# Patient Record
Sex: Male | Born: 1990 | Race: White | Hispanic: No | Marital: Single | Smoking: Never smoker
Health system: Southern US, Community
[De-identification: ages and names within clinical notes are randomized; demographics above are authoritative.]

## PROBLEM LIST (undated history)

## (undated) DIAGNOSIS — R55 Syncope and collapse: Secondary | ICD-10-CM

## (undated) DIAGNOSIS — J45909 Unspecified asthma, uncomplicated: Secondary | ICD-10-CM

## (undated) DIAGNOSIS — F909 Attention-deficit hyperactivity disorder, unspecified type: Secondary | ICD-10-CM

## (undated) HISTORY — DX: Unspecified asthma, uncomplicated: J45.909

---
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
## 2015-03-03 ENCOUNTER — Emergency Department: Payer: Self-pay | Admitting: Emergency Medicine

## 2015-09-26 ENCOUNTER — Ambulatory Visit (INDEPENDENT_AMBULATORY_CARE_PROVIDER_SITE_OTHER): Payer: Self-pay | Admitting: Family Medicine

## 2015-09-26 VITALS — BP 100/60 | HR 71 | Temp 98.2°F | Resp 16 | Ht 73.0 in | Wt 213.6 lb

## 2015-09-26 DIAGNOSIS — M545 Low back pain: Secondary | ICD-10-CM

## 2015-09-26 NOTE — Progress Notes (Signed)
 @[facility] @  This chart was scribed for Elvina SidleKurt Lauenstein, MD by Andrew Auaven Small, ED Scribe. This patient was seen in room 5 and the patient's care was started at 9:30 AM.  Patient ID: Joshua Santos MRN: [id number], DOB: 01-Oct-1991, 24 y.o. Date of Encounter: 09/26/2015, 9:30 AM  Primary Physician: No primary care provider on file.  Chief Complaint:  Chief Complaint  Patient presents with   Back Pain    lower x 24 hours, per pt "does not feel pain right now" had fallen out of the shower    HPI: 24 y.o. year old male with history below presents with low back pain that began 2 days ago. Pt states he was getting out of a shower when he slipped and fell on his bottom. He did have initial soreness to lower back but states pain has subsided. Pt job wanted him to come in and get evaluated. Pt works as a Pensions consultanttechnician.   Past Medical History  Diagnosis Date   Asthma      Home Meds: Prior to Admission medications   Not on File    Allergies:  Allergies  Allergen Reactions   Motrin [Ibuprofen]     Face swelled    Social History   Social History   Marital Status: Married    Spouse Name: N/A   Number of Children: N/A   Years of Education: N/A   Occupational History   Not on file.   Social History Main Topics   Smoking status: Never Smoker    Smokeless tobacco: Not on file   Alcohol Use: No   Drug Use: No   Sexual Activity: Not on file   Other Topics Concern   Not on file   Social History Narrative   No narrative on file     Review of Systems: Constitutional: negative for chills, fever, night sweats, weight changes, or fatigue  HEENT: negative for vision changes, hearing loss, congestion, rhinorrhea, ST, epistaxis, or sinus pressure Cardiovascular: negative for chest pain or palpitations Respiratory: negative for hemoptysis, wheezing, shortness of breath, or cough Abdominal: negative for abdominal pain, nausea, vomiting, diarrhea, or  constipation Dermatological: negative for rash Neurologic: negative for headache, dizziness, or syncope All other systems reviewed and are otherwise negative with the exception to those above and in the HPI.   Physical Exam: Blood pressure 100/60, pulse 71, temperature 98.2 F (36.8 C), temperature source Oral, resp. rate 16, height 6\' 1"  (1.854 m), weight 213 lb 9.6 oz (96.888 kg), SpO2 97 %., Body mass index is 28.19 kg/(m^2). General: Well developed, well nourished, in no acute distress. Head: Normocephalic, atraumatic, eyes without discharge, sclera non-icteric, nares are without discharge. Bilateral auditory canals clear, TM's are without perforation, pearly grey and translucent with reflective cone of light bilaterally. Oral cavity moist, posterior pharynx without exudate, erythema, peritonsillar abscess, or post nasal drip.  Neck: Supple. No thyromegaly. Full ROM. No lymphadenopathy. Lungs: Clear bilaterally to auscultation without wheezes, rales, or rhonchi. Breathing is unlabored. Heart: RRR with S1 S2. No murmurs, rubs, or gallops appreciated. Abdomen: Soft, non-tender, non-distended with normoactive bowel sounds. No hepatomegaly. No rebound/guarding. No obvious abdominal masses. Msk:  Strength and tone normal for age.  Full ROM of back. Negative straight leg raise Extremities/Skin: Warm and dry. No clubbing or cyanosis. No edema. No rashes or suspicious lesions. Neuro: Alert and oriented X 3. Moves all extremities spontaneously. Gait is normal. CNII-XII grossly in tact. Psych:  Responds to questions appropriately with a normal affect.  ASSESSMENT AND PLAN:  24 y.o. year old male with recent minor back injury and needs note to return to work  By signing my name below, I, Raven Small, attest that this documentation has been prepared under the direction and in the presence of Elvina Sidle, MD.  Electronically Signed: Andrew Au, ED Scribe. 09/26/2015. 9:35  AM.   Signed, Elvina Sidle, MD 09/26/2015 9:30 AM

## 2018-11-15 ENCOUNTER — Other Ambulatory Visit: Payer: Self-pay

## 2018-11-15 ENCOUNTER — Encounter: Payer: Self-pay | Admitting: Emergency Medicine

## 2018-11-15 ENCOUNTER — Ambulatory Visit: Payer: 59 | Admitting: Emergency Medicine

## 2018-11-15 VITALS — BP 102/67 | HR 52 | Temp 97.9°F | Resp 16 | Ht 72.5 in | Wt 200.6 lb

## 2018-11-15 DIAGNOSIS — J4520 Mild intermittent asthma, uncomplicated: Secondary | ICD-10-CM | POA: Diagnosis not present

## 2018-11-15 MED ORDER — BECLOMETHASONE DIPROP HFA 40 MCG/ACT IN AERB: 1.0000 | INHALATION_SPRAY | Freq: Two times a day (BID) | RESPIRATORY_TRACT | 11 refills | Status: DC

## 2018-11-15 MED ORDER — ALBUTEROL SULFATE HFA 108 (90 BASE) MCG/ACT IN AERS: 2.0000 | INHALATION_SPRAY | Freq: Four times a day (QID) | RESPIRATORY_TRACT | 0 refills | Status: DC | PRN

## 2018-11-15 NOTE — Patient Instructions (Addendum)
If you have lab work done today you will be contacted with your lab results within the next 2 weeks.  If you have not heard from us then please contact us. The fastest way to get your results is to register for My Chart.   IF you received an x-ray today, you will receive an invoice from [facility]. Please contact [facility] at [phone] with questions or concerns regarding your invoice.   IF you received labwork today, you will receive an invoice from [facility]. Please contact [facility] at [phone] with questions or concerns regarding your invoice.   Our billing staff will not be able to assist you with questions regarding bills from these companies.  You will be contacted with the lab results as soon as they are available. The fastest way to get your results is to activate your My Chart account. Instructions are located on the last page of this paperwork. If you have not heard from us regarding the results in 2 weeks, please contact this office.     Asthma, Adult Asthma is a condition of the lungs in which the airways tighten and narrow. Asthma can make it hard to breathe. Asthma cannot be cured, but medicine and lifestyle changes can help control it. Asthma may be started (triggered) by:  Animal skin flakes (dander).  Dust.  Cockroaches.  Pollen.  Mold.  Smoke.  Cleaning products.  Hair sprays or aerosol sprays.  Paint fumes or strong smells.  Cold air, weather changes, and winds.  Crying or laughing hard.  Stress.  Certain medicines or drugs.  Foods, such as dried fruit, potato chips, and sparkling grape juice.  Infections or conditions (colds, flu).  Exercise.  Certain medical conditions or diseases.  Exercise or tiring activities.  Follow these instructions at home:  Take medicine as told by your doctor.  Use a peak flow meter as told by your doctor. A peak flow meter is a tool that measures how well the lungs are  working.  Record and keep track of the peak flow meter's readings.  Understand and use the asthma action plan. An asthma action plan is a written plan for taking care of your asthma and treating your attacks.  To help prevent asthma attacks: ? Do not smoke. Stay away from secondhand smoke. ? Change your heating and air conditioning filter often. ? Limit your use of fireplaces and wood stoves. ? Get rid of pests (such as roaches and mice) and their droppings. ? Throw away plants if you see mold on them. ? Clean your floors. Dust regularly. Use cleaning products that do not smell. ? Have someone vacuum when you are not home. Use a vacuum cleaner with a HEPA filter if possible. ? Replace carpet with wood, tile, or vinyl flooring. Carpet can trap animal skin flakes and dust. ? Use allergy-proof pillows, mattress covers, and box spring covers. ? Wash bed sheets and blankets every week in hot water and dry them in a dryer. ? Use blankets that are made of polyester or cotton. ? Clean bathrooms and kitchens with bleach. If possible, have someone repaint the walls in these rooms with mold-resistant paint. Keep out of the rooms that are being cleaned and painted. ? Wash hands often. Contact a doctor if:  You have make a whistling sound when breaking (wheeze), have shortness of breath, or have a cough even if taking medicine to prevent attacks.  The colored mucus you cough up (sputum) is thicker than usual.  The colored mucus you cough up changes from clear or white to yellow, green, gray, or bloody.  You have problems from the medicine you are taking such as: ? A rash. ? Itching. ? Swelling. ? Trouble breathing.  You need reliever medicines more than 2-3 times a week.  Your peak flow measurement is still at 50-79% of your personal best after following the action plan for 1 hour.  You have a fever. Get help right away if:  You seem to be worse and are not responding to medicine during  an asthma attack.  You are short of breath even at rest.  You get short of breath when doing very little activity.  You have trouble eating, drinking, or talking.  You have chest pain.  You have a fast heartbeat.  Your lips or fingernails start to turn blue.  You are light-headed, dizzy, or faint.  Your peak flow is less than 50% of your personal best. This information is not intended to replace advice given to you by your health care provider. Make sure you discuss any questions you have with your health care provider. Document Released: 05/12/2008 Document Revised: 05/01/2016 Document Reviewed: 06/23/2013 Elsevier Interactive Patient Education  2017 [employer]

## 2018-11-15 NOTE — Progress Notes (Signed)
Joshua Santos 27 y.o.   Chief Complaint  Patient presents with  . Establish Care  . Asthma    per patient has Hx- SOB with wheezing    HISTORY OF PRESENT ILLNESS: This is a 27 y.o. male with a history of asthma, on no medications at this time.  Getting frequent asthma attacks.  Feels he needs to be on medication.  No other complaints or medical concerns. Here to establish care.  HPI   Prior to Admission medications   Medication Sig Start Date End Date Taking? Authorizing Provider  Multiple Vitamin (MULTI VITAMIN DAILY PO) Take by mouth daily.   Yes [provider]    Allergies  Allergen Reactions  . Motrin [Ibuprofen]     Face swelled    There are no active problems to display for this patient.   Past Medical History:  Diagnosis Date  . Asthma     No past surgical history on file.  Social History   Socioeconomic History  . Marital status: Married    Spouse name: Not on file  . Number of children: Not on file  . Years of education: Not on file  . Highest education level: Not on file  Occupational History  . Not on file  Social Needs  . Financial resource strain: Not on file  . Food insecurity:    Worry: Not on file    Inability: Not on file  . Transportation needs:    Medical: Not on file    Non-medical: Not on file  Tobacco Use  . Smoking status: Never Smoker  . Smokeless tobacco: Never Used  Substance and Sexual Activity  . Alcohol use: No    Alcohol/week: 0.0 standard drinks    Comment: slightly-wine  . Drug use: No  . Sexual activity: Not on file  Lifestyle  . Physical activity:    Days per week: Not on file    Minutes per session: Not on file  . Stress: Not on file  Relationships  . Social connections:    Talks on phone: Not on file    Gets together: Not on file    Attends religious service: Not on file    Active member of club or organization: Not on file    Attends meetings of clubs or organizations: Not on file   Relationship status: Not on file  . Intimate partner violence:    Fear of current or ex partner: Not on file    Emotionally abused: Not on file    Physically abused: Not on file    Forced sexual activity: Not on file  Other Topics Concern  . Not on file  Social History Narrative  . Not on file    Family History  Problem Relation Age of Onset  . Anxiety disorder Mother   . Cancer Maternal Grandfather        brain     Review of Systems  Constitutional: Negative.  Negative for chills and fever.  HENT: Negative.  Negative for congestion, hearing loss, nosebleeds and sore throat.   Eyes: Negative.  Negative for blurred vision and double vision.  Respiratory: Positive for shortness of breath and wheezing.   Cardiovascular: Negative.  Negative for chest pain and palpitations.  Gastrointestinal: Negative.  Negative for abdominal pain, blood in stool, diarrhea, melena, nausea and vomiting.  Genitourinary: Negative.  Negative for dysuria.  Skin: Negative.  Negative for rash.  Neurological: Negative.  Negative for dizziness and headaches.  Endo/Heme/Allergies: Negative.  All other systems reviewed and are negative.   Vitals:   11/15/18 1015  BP: 102/67  Pulse: (!) 52  Resp: 16  Temp: 97.9 F (36.6 C)  SpO2: 95%    Physical Exam  Constitutional: He is oriented to person, place, and time. He appears well-developed and well-nourished.  HENT:  Head: Normocephalic and atraumatic.  Right Ear: External ear normal.  Left Ear: External ear normal.  Nose: Nose normal.  Mouth/Throat: Oropharynx is clear and moist.  Eyes: Pupils are equal, round, and reactive to light. Conjunctivae and EOM are normal.  Neck: Normal range of motion. Neck supple. No thyromegaly present.  Cardiovascular: Normal rate, regular rhythm and normal heart sounds.  Pulmonary/Chest: Effort normal and breath sounds normal.  Musculoskeletal: Normal range of motion.  Lymphadenopathy:    He has no cervical  adenopathy.  Neurological: He is alert and oriented to person, place, and time. No sensory deficit. He exhibits normal muscle tone. Coordination normal.  Skin: Skin is warm and dry. Capillary refill takes less than 2 seconds.  Psychiatric: He has a normal mood and affect. His behavior is normal.  Vitals reviewed.  A total of 25 minutes was spent in the room with the patient, greater than 50% of which was in counseling/coordination of care regarding diagnosis, medications, treatment, prognosis, and need for follow-up.   ASSESSMENT & PLAN: Joshua Santos was seen today for establish care and asthma.  Diagnoses and all orders for this visit:  Mild intermittent asthma without complication -     beclomethasone (QVAR REDIHALER) 40 MCG/ACT inhaler; Inhale 1 puff into the lungs 2 (two) times daily. -     albuterol (PROVENTIL HFA;VENTOLIN HFA) 108 (90 Base) MCG/ACT inhaler; Inhale 2 puffs into the lungs every 6 (six) hours as needed for wheezing or shortness of breath.     Patient Instructions       If you have lab work done today you will be contacted with your lab results within the next 2 weeks.  If you have not heard from [place] then please contact us. The fastest way to get your results is to register for My Chart.   IF you received an x-ray today, you will receive an invoice from [facility]. Please contact [facility] at [phone] with questions or concerns regarding your invoice.   IF you received labwork today, you will receive an invoice from [place]. Please contact [facility] at [phone] with questions or concerns regarding your invoice.   Our billing staff will not be able to assist you with questions regarding bills from these companies.  You will be contacted with the lab results as soon as they are available. The fastest way to get your results is to activate your My Chart account. Instructions are located on the last page of this paperwork. If you have not  heard from [place] regarding the results in 2 weeks, please contact this office.     Asthma, Adult Asthma is a condition of the lungs in which the airways tighten and narrow. Asthma can make it hard to breathe. Asthma cannot be cured, but medicine and lifestyle changes can help control it. Asthma may be started (triggered) by:  Animal skin flakes (dander).  Dust.  Cockroaches.  Pollen.  Mold.  Smoke.  Cleaning products.  Hair sprays or aerosol sprays.  Paint fumes or strong smells.  Cold air, weather changes, and winds.  Crying or laughing hard.  Stress.  Certain medicines or drugs.  Foods, such as dried fruit, potato  chips, and sparkling grape juice.  Infections or conditions (colds, flu).  Exercise.  Certain medical conditions or diseases.  Exercise or tiring activities.  Follow these instructions at home:  Take medicine as told by your doctor.  Use a peak flow meter as told by your doctor. A peak flow meter is a tool that measures how well the lungs are working.  Record and keep track of the peak flow meter's readings.  Understand and use the asthma action plan. An asthma action plan is a written plan for taking care of your asthma and treating your attacks.  To help prevent asthma attacks: ? Do not smoke. Stay away from secondhand smoke. ? Change your heating and air conditioning filter often. ? Limit your use of fireplaces and wood stoves. ? Get rid of pests (such as roaches and mice) and their droppings. ? Throw away plants if you see mold on them. ? Clean your floors. Dust regularly. Use cleaning products that do not smell. ? Have someone vacuum when you are not home. Use a vacuum cleaner with a HEPA filter if possible. ? Replace carpet with wood, tile, or vinyl flooring. Carpet can trap animal skin flakes and dust. ? Use allergy-proof pillows, mattress covers, and box spring covers. ? Wash bed sheets and blankets every week in hot water and dry them  in a dryer. ? Use blankets that are made of polyester or cotton. ? Clean bathrooms and kitchens with bleach. If possible, have someone repaint the walls in these rooms with mold-resistant paint. Keep out of the rooms that are being cleaned and painted. ? Wash hands often. Contact a doctor if:  You have make a whistling sound when breaking (wheeze), have shortness of breath, or have a cough even if taking medicine to prevent attacks.  The colored mucus you cough up (sputum) is thicker than usual.  The colored mucus you cough up changes from clear or white to yellow, green, gray, or bloody.  You have problems from the medicine you are taking such as: ? A rash. ? Itching. ? Swelling. ? Trouble breathing.  You need reliever medicines more than 2-3 times a week.  Your peak flow measurement is still at 50-79% of your personal best after following the action plan for 1 hour.  You have a fever. Get help right away if:  You seem to be worse and are not responding to medicine during an asthma attack.  You are short of breath even at rest.  You get short of breath when doing very little activity.  You have trouble eating, drinking, or talking.  You have chest pain.  You have a fast heartbeat.  Your lips or fingernails start to turn blue.  You are light-headed, dizzy, or faint.  Your peak flow is less than 50% of your personal best. This information is not intended to replace advice given to you by your health care provider. Make sure you discuss any questions you have with your health care provider. Document Released: 05/12/2008 Document Revised: 05/01/2016 Document Reviewed: 06/23/2013 Elsevier Interactive Patient Education  2017 [employer] Inc.     Edwina BarthMiguel Alder Murri, MD [facility]

## 2018-12-25 ENCOUNTER — Other Ambulatory Visit: Payer: Self-pay | Admitting: Emergency Medicine

## 2018-12-25 DIAGNOSIS — J4520 Mild intermittent asthma, uncomplicated: Secondary | ICD-10-CM

## 2019-04-08 ENCOUNTER — Telehealth: Payer: Self-pay | Admitting: Emergency Medicine

## 2019-04-08 NOTE — Telephone Encounter (Signed)
Please change if appropriate.

## 2019-04-08 NOTE — Telephone Encounter (Signed)
Copied from CRM [id number]. Topic: General - Other >> Apr 08, 2019 11:42 AM Herby Abraham C wrote: Reason for CRM: pt came in and was prescribed VENTOLIN HFA 108 (90 Base) MCG/ACT inhaler. Pt is request to change to the Albuterol instead. Pt says that it is less expensive for him.     Pharmacy: [id number] [place], [place] - [address] [phone] (Phone) [phone] (Fax)   Please assist.

## 2019-04-11 ENCOUNTER — Other Ambulatory Visit: Payer: Self-pay | Admitting: Emergency Medicine

## 2019-04-11 MED ORDER — ALBUTEROL SULFATE HFA 108 (90 BASE) MCG/ACT IN AERS: 2.0000 | INHALATION_SPRAY | Freq: Four times a day (QID) | RESPIRATORY_TRACT | 5 refills | Status: AC | PRN

## 2019-04-11 NOTE — Telephone Encounter (Signed)
Changed.  Thanks.

## 2019-05-22 ENCOUNTER — Other Ambulatory Visit: Payer: Self-pay | Admitting: Emergency Medicine

## 2019-05-22 DIAGNOSIS — J4520 Mild intermittent asthma, uncomplicated: Secondary | ICD-10-CM

## 2019-07-25 ENCOUNTER — Ambulatory Visit: Payer: 59 | Admitting: Registered Nurse

## 2020-01-22 ENCOUNTER — Other Ambulatory Visit: Payer: Self-pay

## 2020-01-22 ENCOUNTER — Ambulatory Visit
Admission: EM | Admit: 2020-01-22 | Discharge: 2020-01-22 | Disposition: A | Discharge status: Home / Self Care | Payer: BC Managed Care – PPO

## 2020-01-22 DIAGNOSIS — K047 Periapical abscess without sinus: Secondary | ICD-10-CM | POA: Diagnosis not present

## 2020-01-22 MED ORDER — AMOXICILLIN-POT CLAVULANATE 875-125 MG PO TABS: 1.0000 | ORAL_TABLET | Freq: Two times a day (BID) | ORAL | 0 refills | Status: AC

## 2020-01-22 NOTE — ED Provider Notes (Signed)
[facility]    CSN: [id number] Arrival date & time: 01/22/20  0956      History   Chief Complaint Chief Complaint  Patient presents with  . Dental Pain    HPI Joshua Santos is a 29 y.o. male presenting for 2-day course of left upper tooth swelling, pain.  States he has a cracked tooth for the last 3 years, though did not have dental insurance until recently.  No dentist appointment scheduled at this time.  Has been using salt water gargles, Tylenol with moderate relief of pain.  Denies history of dental abscess, retropharyngeal abscess.  No drooling, difficulty breathing or swallowing, fever, chest pain.   Past Medical History:  Diagnosis Date  . Asthma     There are no problems to display for this patient.   History reviewed. No pertinent surgical history.     Home Medications    Prior to Admission medications   Medication Sig Start Date End Date Taking? Authorizing Provider  fluticasone furoate-vilanterol (BREO ELLIPTA) 100-25 MCG/INH AEPB Inhale 1 puff into the lungs daily.   Yes [provider]  albuterol (VENTOLIN HFA) 108 (90 Base) MCG/ACT inhaler Inhale 2 puffs into the lungs every 6 (six) hours as needed for wheezing or shortness of breath. 04/11/19   Georgina Quint, MD  amoxicillin-clavulanate (AUGMENTIN) 875-125 MG tablet Take 1 tablet by mouth 2 (two) times daily for 7 days. 01/22/20 01/29/20  Hall-Potvin, [place], PA-C  Multiple Vitamin (MULTI VITAMIN DAILY PO) Take by mouth daily.    [provider]    Family History Family History  Problem Relation Age of Onset  . Anxiety disorder Mother   . Cancer Maternal Grandfather        brain    Social History Social History   Tobacco Use  . Smoking status: Never Smoker  . Smokeless tobacco: Never Used  Substance Use Topics  . Alcohol use: Yes    Alcohol/week: 0.0 standard drinks    Comment: slightly-wine  . Drug use: No     Allergies   Motrin  [ibuprofen]   Review of Systems As per HPI   Physical Exam Triage Vital Signs ED Triage Vitals  Enc Vitals Group     BP      Pulse      Resp      Temp      Temp src      SpO2      Weight      Height      Head Circumference      Peak Flow      Pain Score      Pain Loc      Pain Edu?      Excl. in GC?    No data found.  Updated Vital Signs BP (!) 142/92 (BP Location: Left Arm)   Pulse 82   Temp 98.2 F (36.8 C) (Oral)   Resp 16   SpO2 96%   Visual Acuity Right Eye Distance:   Left Eye Distance:   Bilateral Distance:    Right Eye Near:   Left Eye Near:    Bilateral Near:     Physical Exam Constitutional:      General: He is not in acute distress. HENT:     Head: Normocephalic and atraumatic.     Mouth/Throat:     Mouth: Mucous membranes are moist.     Pharynx: Oropharynx is clear. No oropharyngeal exudate or posterior oropharyngeal erythema.  Comments: In addition.  Top left first molar cracked with decay down to gumline.  Moderate gingival edema with some erythema.  No open wound or active discharge or fluctuance.  Exquisite TTP. Eyes:     General: No scleral icterus.    Conjunctiva/sclera: Conjunctivae normal.     Pupils: Pupils are equal, round, and reactive to light.  Cardiovascular:     Rate and Rhythm: Normal rate.  Pulmonary:     Effort: Pulmonary effort is normal. No respiratory distress.     Breath sounds: No wheezing.  Musculoskeletal:     Cervical back: Normal range of motion and neck supple. No tenderness.  Lymphadenopathy:     Cervical: No cervical adenopathy.  Skin:    Coloration: Skin is not jaundiced or pale.  Neurological:     Mental Status: He is alert and oriented to person, place, and time.      UC Treatments / Results  Labs (all labs ordered are listed, but only abnormal results are displayed) Labs Reviewed - No data to display  EKG   Radiology No results found.  Procedures Procedures (including critical care  time)  Medications Ordered in UC Medications - No data to display  Initial Impression / Assessment and Plan / UC Course  I have reviewed the triage vital signs and the nursing notes.  Pertinent labs & imaging results that were available during my care of the patient were reviewed by me and considered in my medical decision making (see chart for details).     Afebrile, nontoxic in office today.  Blood pressure mildly elevated, though will follow up with PCP for further evaluation/management thereof.  Will start Augmentin today, have patient follow-up with dentist Monday via phone: Low cost community resources provided.  Return precautions discussed, patient verbalized understanding and is agreeable to plan. Final Clinical Impressions(s) / UC Diagnoses   Final diagnoses:  Dental infection     Discharge Instructions     Low-Cost Community Dental Resources:  [facility] Address: [address], [place], [place], [address] Phone: [phone]  - Dr. Donn Pierini Address: [address], [place], [place], [address] Phone: [phone]    ED Prescriptions    Medication Sig Dispense Auth. Provider   amoxicillin-clavulanate (AUGMENTIN) 875-125 MG tablet Take 1 tablet by mouth 2 (two) times daily for 7 days. 14 tablet Hall-Potvin, [place], PA-C     I have reviewed the PDMP during this encounter.   Hall-Potvin, [place], [place] 01/22/20 1039

## 2020-01-22 NOTE — Discharge Instructions (Addendum)
Low-Cost Community Dental Resources:  [place] Address: [address], [place], [place], [address] Phone: [phone]  - Dr. Civils Address: [address], [place], [place], [address] Phone: [phone] 

## 2020-01-22 NOTE — ED Triage Notes (Signed)
Patient is here with dental pain and facial swelling that started x 2 days ago.  He reports having a cracked tooth that has not been evaluated by a dentist.  He has tried Tylenol ES, salt water rinses and peroxide without much relief.

## 2020-01-22 NOTE — ED Notes (Signed)
Patient able to ambulate independently  

## 2021-03-15 ENCOUNTER — Ambulatory Visit
Admission: EM | Admit: 2021-03-15 | Discharge: 2021-03-15 | Disposition: A | Discharge status: Home / Self Care | Payer: BC Managed Care – PPO | Attending: Emergency Medicine | Admitting: Emergency Medicine

## 2021-03-15 ENCOUNTER — Telehealth: Payer: Self-pay

## 2021-03-15 ENCOUNTER — Other Ambulatory Visit: Payer: Self-pay

## 2021-03-15 DIAGNOSIS — R112 Nausea with vomiting, unspecified: Secondary | ICD-10-CM | POA: Diagnosis not present

## 2021-03-15 DIAGNOSIS — R197 Diarrhea, unspecified: Secondary | ICD-10-CM

## 2021-03-15 MED ORDER — ONDANSETRON 4 MG PO TBDP
4.0000 mg | ORAL_TABLET | Freq: Once | ORAL | Status: AC
  Administered 2021-03-15: 4 mg via ORAL

## 2021-03-15 MED ORDER — ONDANSETRON 4 MG PO TBDP: 4.0000 mg | ORAL_TABLET | Freq: Three times a day (TID) | ORAL | 0 refills | Status: DC | PRN

## 2021-03-15 NOTE — ED Provider Notes (Signed)
[facility]    CSN: [id number] Arrival date & time: 03/15/21  1015      History   Chief Complaint Chief Complaint  Patient presents with  . Emesis  . Diarrhea    HPI Joshua Santos is a 30 y.o. male.   Patient presents with 3-day history of nausea, vomiting, diarrhea.  1 episode of emesis on 03/12/2021; no episodes for the next 2 days; then 3 episodes of emesis with 3 episodes of diarrhea today.  He states he had cold-like symptoms last week but these have resolved.  He denies fever, chills, rash, sore throat, cough, shortness of breath, abdominal pain, dysuria, or other symptoms.  No treatments attempted at home.  His medical history includes asthma.  The history is provided by the patient.    Past Medical History:  Diagnosis Date  . Asthma     There are no problems to display for this patient.   History reviewed. No pertinent surgical history.     Home Medications    Prior to Admission medications   Medication Sig Start Date End Date Taking? Authorizing Provider  ondansetron (ZOFRAN ODT) 4 MG disintegrating tablet Take 1 tablet (4 mg total) by mouth every 8 (eight) hours as needed for nausea or vomiting. 03/15/21  Yes Mickie Bail, NP  albuterol (VENTOLIN HFA) 108 (90 Base) MCG/ACT inhaler Inhale 2 puffs into the lungs every 6 (six) hours as needed for wheezing or shortness of breath. 04/11/19   Georgina Quint, MD  fluticasone furoate-vilanterol (BREO ELLIPTA) 100-25 MCG/INH AEPB Inhale 1 puff into the lungs daily.    [provider]  Multiple Vitamin (MULTI VITAMIN DAILY PO) Take by mouth daily.    [provider]    Family History Family History  Problem Relation Age of Onset  . Anxiety disorder Mother   . Cancer Maternal Grandfather        brain    Social History Social History   Tobacco Use  . Smoking status: Never Smoker  . Smokeless tobacco: Never Used  Substance Use Topics  . Alcohol use: Yes    Alcohol/week: 0.0  standard drinks    Comment: slightly-wine  . Drug use: No     Allergies   Motrin [ibuprofen]   Review of Systems Review of Systems  Constitutional: Negative for chills and fever.  HENT: Negative for ear pain and sore throat.   Eyes: Negative for pain and visual disturbance.  Respiratory: Negative for cough and shortness of breath.   Cardiovascular: Negative for chest pain and palpitations.  Gastrointestinal: Positive for diarrhea, nausea and vomiting. Negative for abdominal pain.  Genitourinary: Negative for dysuria and hematuria.  Musculoskeletal: Negative for arthralgias and back pain.  Skin: Negative for color change and rash.  Neurological: Negative for seizures and syncope.  All other systems reviewed and are negative.    Physical Exam Triage Vital Signs ED Triage Vitals  Enc Vitals Group     BP 03/15/21 1026 107/65     Pulse Rate 03/15/21 1026 (!) 113     Resp 03/15/21 1026 20     Temp 03/15/21 1026 97.9 F (36.6 C)     Temp Source 03/15/21 1026 Oral     SpO2 03/15/21 1026 96 %     Weight --      Height --      Head Circumference --      Peak Flow --      Pain Score 03/15/21 1027 0  Pain Loc --      Pain Edu? --      Excl. in GC? --    No data found.  Updated Vital Signs BP 107/65 (BP Location: Left Arm)   Pulse (!) 113   Temp 97.9 F (36.6 C) (Oral)   Resp 20   SpO2 96%   Visual Acuity Right Eye Distance:   Left Eye Distance:   Bilateral Distance:    Right Eye Near:   Left Eye Near:    Bilateral Near:     Physical Exam Vitals and nursing note reviewed.  Constitutional:      General: He is not in acute distress.    Appearance: He is well-developed.  HENT:     Head: Normocephalic and atraumatic.     Mouth/Throat:     Mouth: Mucous membranes are moist.     Pharynx: Oropharynx is clear.  Eyes:     Conjunctiva/sclera: Conjunctivae normal.  Cardiovascular:     Rate and Rhythm: Normal rate and regular rhythm.     Heart sounds: Normal  heart sounds.  Pulmonary:     Effort: Pulmonary effort is normal. No respiratory distress.     Breath sounds: Normal breath sounds.  Abdominal:     General: Bowel sounds are increased. There is no distension.     Palpations: Abdomen is soft.     Tenderness: There is no abdominal tenderness. There is no guarding or rebound.  Musculoskeletal:     Cervical back: Neck supple.  Skin:    General: Skin is warm and dry.  Neurological:     General: No focal deficit present.     Mental Status: He is alert and oriented to person, place, and time.     Gait: Gait normal.  Psychiatric:        Mood and Affect: Mood normal.        Behavior: Behavior normal.      UC Treatments / Results  Labs (all labs ordered are listed, but only abnormal results are displayed) Labs Reviewed - No data to display  EKG   Radiology No results found.  Procedures Procedures (including critical care time)  Medications Ordered in UC Medications  ondansetron (ZOFRAN-ODT) disintegrating tablet 4 mg (4 mg Oral Given 03/15/21 1111)    Initial Impression / Assessment and Plan / UC Course  I have reviewed the triage vital signs and the nursing notes.  Pertinent labs & imaging results that were available during my care of the patient were reviewed by me and considered in my medical decision making (see chart for details).   Nausea, non-intractable vomiting, diarrhea.  Patient reports taking 2 at home COVID test which were both negative.  Zofran given here and patient able to tolerate oral fluids without emesis.  Treating at home with Zofran as needed for nausea/vomiting.  Instructed patient to keep himself hydrated with clear liquids and to follow the diarrhea diet as tolerated.  Discussed going to the ED if he is unable to stay hydrated at home.  Instructed him to follow-up with his PCP as needed.  He agrees to plan of care.   Final Clinical Impressions(s) / UC Diagnoses   Final diagnoses:  Non-intractable  vomiting with nausea, unspecified vomiting type  Diarrhea, unspecified type     Discharge Instructions     Take the antinausea medication as directed.    Keep yourself hydrated with clear liquids, such as water, Gatorade, Pedialyte, Sprite, or ginger ale.  Follow the attached diarrhea  diet as tolerated.  Go to the emergency department if you have acute worsening symptoms.    Follow up with your primary care provider if your symptoms are not improving.         ED Prescriptions    Medication Sig Dispense Auth. Provider   ondansetron (ZOFRAN ODT) 4 MG disintegrating tablet Take 1 tablet (4 mg total) by mouth every 8 (eight) hours as needed for nausea or vomiting. 20 tablet Mickie Bail, NP     PDMP not reviewed this encounter.   Mickie Bail, NP 03/15/21 1137

## 2021-03-15 NOTE — ED Triage Notes (Signed)
Pt present N/V/D, symptms started two weeks ago. Pt states symptom recurrent and he cannot keep anything down.

## 2021-03-15 NOTE — Discharge Instructions (Signed)
Take the antinausea medication as directed.    Keep yourself hydrated with clear liquids, such as water, Gatorade, Pedialyte, Sprite, or ginger ale.  Follow the attached diarrhea diet as tolerated.    Go to the emergency department if you have acute worsening symptoms.    Follow up with your primary care provider if your symptoms are not improving.      

## 2021-07-11 ENCOUNTER — Encounter: Payer: Self-pay | Admitting: Emergency Medicine

## 2021-07-11 ENCOUNTER — Ambulatory Visit
Admission: EM | Admit: 2021-07-11 | Discharge: 2021-07-11 | Disposition: A | Discharge status: Home / Self Care | Payer: BC Managed Care – PPO

## 2021-07-11 ENCOUNTER — Other Ambulatory Visit: Payer: Self-pay

## 2021-07-11 DIAGNOSIS — L03011 Cellulitis of right finger: Secondary | ICD-10-CM

## 2021-07-11 HISTORY — DX: Attention-deficit hyperactivity disorder, unspecified type: F90.9

## 2021-07-11 MED ORDER — CEPHALEXIN 250 MG/5ML PO SUSR: 500.0000 mg | Freq: Two times a day (BID) | ORAL | 0 refills | Status: AC

## 2021-07-11 MED ORDER — MUPIROCIN 2 % EX OINT: 1.0000 "application " | TOPICAL_OINTMENT | Freq: Two times a day (BID) | CUTANEOUS | 0 refills | Status: DC

## 2021-07-11 NOTE — ED Triage Notes (Signed)
Patient has hx of biting nails. C/o right second finger infection bedside nailbed starting 3 days ago. Red, swollen, tender to touch.

## 2021-07-11 NOTE — ED Provider Notes (Signed)
[facility]    CSN: [id number] Arrival date & time: 07/11/21  1240      History   Chief Complaint Chief Complaint  Patient presents with   Finger Injury    HPI Joshua Santos is a 30 y.o. male.   Patient presenting today with several day history of acutely worsening right pointer finger pain, redness, swelling surrounding the nail edge where he has been biting the nail.  He denies fever, chills, drainage from the area, decreased range of motion, redness extending into the hand.  Has not been trying anything over-the-counter for symptoms thus far.   Past Medical History:  Diagnosis Date   ADHD    Asthma     There are no problems to display for this patient.   History reviewed. No pertinent surgical history.     Home Medications    Prior to Admission medications   Medication Sig Start Date End Date Taking? Authorizing Provider  amphetamine-dextroamphetamine (ADDERALL) 20 MG tablet Take by mouth. 06/28/21 08/27/21 Yes [provider]  cephALEXin (KEFLEX) 250 MG/5ML suspension Take 10 mLs (500 mg total) by mouth 2 (two) times daily for 7 days. 07/11/21 07/18/21 Yes Particia Nearing, PA-C  fluticasone-salmeterol (ADVAIR [facility]) [phone] MCG/ACT inhaler Inhale 2 puffs into the lungs 2 (two) times daily. 10/09/20  Yes [provider]  mupirocin ointment (BACTROBAN) 2 % Apply 1 application topically 2 (two) times daily. 07/11/21  Yes Particia Nearing, PA-C  albuterol (VENTOLIN HFA) 108 (90 Base) MCG/ACT inhaler Inhale 2 puffs into the lungs every 6 (six) hours as needed for wheezing or shortness of breath. 04/11/19   Georgina Quint, MD  fluticasone furoate-vilanterol (BREO ELLIPTA) 100-25 MCG/INH AEPB Inhale 1 puff into the lungs daily.    [provider]  Multiple Vitamin (MULTI VITAMIN DAILY PO) Take by mouth daily.    [provider]  ondansetron (ZOFRAN ODT) 4 MG disintegrating tablet Take 1 tablet (4 mg total) by mouth  every 8 (eight) hours as needed for nausea or vomiting. 03/15/21   Mickie Bail, NP    Family History Family History  Problem Relation Age of Onset   Anxiety disorder Mother    Cancer Maternal Grandfather        brain    Social History Social History   Tobacco Use   Smoking status: Never   Smokeless tobacco: Never  Substance Use Topics   Alcohol use: Yes    Alcohol/week: 0.0 standard drinks    Comment: slightly-wine   Drug use: No     Allergies   Motrin [ibuprofen]   Review of Systems Review of Systems Per HPI  Physical Exam Triage Vital Signs ED Triage Vitals [07/11/21 1328]  Enc Vitals Group     BP 109/73     Pulse Rate (!) 102     Resp 16     Temp 98 F (36.7 C)     Temp Source Oral     SpO2 96 %     Weight      Height      Head Circumference      Peak Flow      Pain Score 1     Pain Loc      Pain Edu?      Excl. in GC?    No data found.  Updated Vital Signs BP 109/73 (BP Location: Right Arm)   Pulse (!) 102   Temp 98 F (36.7 C) (Oral)   Resp 16  SpO2 96%   Visual Acuity Right Eye Distance:   Left Eye Distance:   Bilateral Distance:    Right Eye Near:   Left Eye Near:    Bilateral Near:     Physical Exam Vitals and nursing note reviewed.  Constitutional:      Appearance: Normal appearance.  HENT:     Head: Atraumatic.  Eyes:     Extraocular Movements: Extraocular movements intact.     Conjunctiva/sclera: Conjunctivae normal.  Cardiovascular:     Rate and Rhythm: Normal rate and regular rhythm.  Pulmonary:     Effort: Pulmonary effort is normal.     Breath sounds: Normal breath sounds.  Musculoskeletal:        General: Swelling and tenderness present. Normal range of motion.     Cervical back: Normal range of motion and neck supple.     Comments: Trace edema right distal pointer finger, tender to palpation, range of motion full and intact.  Skin:    General: Skin is warm and dry.     Findings: Erythema present.      Comments: Erythema of nail bed bilaterally and extending to distal fingertip of right pointer finger.  No abscess or fluctuance at this point.  Tender to palpation.  Neurological:     General: No focal deficit present.     Mental Status: He is oriented to person, place, and time.     Sensory: No sensory deficit.     Motor: No weakness.     Gait: Gait normal.     Comments: Right hand neurovascularly intact  Psychiatric:        Mood and Affect: Mood normal.        Thought Content: Thought content normal.        Judgment: Judgment normal.   UC Treatments / Results  Labs (all labs ordered are listed, but only abnormal results are displayed) Labs Reviewed - No data to display  EKG   Radiology No results found.  Procedures Procedures (including critical care time)  Medications Ordered in UC Medications - No data to display  Initial Impression / Assessment and Plan / UC Course  I have reviewed the triage vital signs and the nursing notes.  Pertinent labs & imaging results that were available during my care of the patient were reviewed by me and considered in my medical decision making (see chart for details).     Developing paronychia, not yet with fluctuant region that requires I&D.  We will treat with Epsom salt soaks, Keflex, Bactroban.  Follow-up for worsening symptoms.  Final Clinical Impressions(s) / UC Diagnoses   Final diagnoses:  Paronychia of finger of right hand   Discharge Instructions   None    ED Prescriptions     Medication Sig Dispense Auth. Provider   cephALEXin (KEFLEX) 250 MG/5ML suspension Take 10 mLs (500 mg total) by mouth 2 (two) times daily for 7 days. 140 mL Particia Nearing, PA-C   mupirocin ointment (BACTROBAN) 2 % Apply 1 application topically 2 (two) times daily. 22 g Particia Nearing, [place]      PDMP not reviewed this encounter.   Particia Nearing, [place] 07/11/21 1434

## 2022-01-23 ENCOUNTER — Ambulatory Visit
Admission: EM | Admit: 2022-01-23 | Discharge: 2022-01-23 | Disposition: A | Discharge status: Home / Self Care | Payer: PRIVATE HEALTH INSURANCE | Attending: Internal Medicine | Admitting: Internal Medicine

## 2022-01-23 ENCOUNTER — Other Ambulatory Visit: Payer: Self-pay

## 2022-01-23 ENCOUNTER — Encounter: Payer: Self-pay | Admitting: Emergency Medicine

## 2022-01-23 DIAGNOSIS — B349 Viral infection, unspecified: Secondary | ICD-10-CM | POA: Diagnosis present

## 2022-01-23 DIAGNOSIS — J029 Acute pharyngitis, unspecified: Secondary | ICD-10-CM | POA: Diagnosis present

## 2022-01-23 LAB — POCT RAPID STREP A (OFFICE): Rapid Strep A Screen: NEGATIVE

## 2022-01-23 LAB — POCT INFLUENZA A/B
Influenza A, POC: NEGATIVE
Influenza B, POC: NEGATIVE

## 2022-01-23 NOTE — Discharge Instructions (Signed)
It appears that you have a viral illness that should self resolve in the next few days.  Rapid flu and rapid strep are negative.  Throat culture and COVID test are pending.  We will call if they are positive.  Please follow-up if symptoms persist or worsen.

## 2022-01-23 NOTE — ED Triage Notes (Signed)
Patient c/o body aches, headache, swollen lymph nodes, exhausted x 2 days.  Patient unsure if he has the flu.

## 2022-01-23 NOTE — ED Provider Notes (Signed)
[facility]    CSN: [id number] Arrival date & time: 01/23/22  0801      History   Chief Complaint Chief Complaint  Patient presents with   Possible Flu    HPI Joshua Santos is a 31 y.o. male.   Patient presents with body aches, headache, swollen lymph nodes, fatigue, sore throat that has been present for approximately 2 days.  Denies nasal congestion, cough, chest pain, shortness of breath, nausea, vomiting, diarrhea, abdominal pain.  Tmax at home was 101.  He reports that his wife has had similar symptoms recently.  Has taken Sudafed with minimal improvement.    Past Medical History:  Diagnosis Date   ADHD    Asthma     There are no problems to display for this patient.   History reviewed. No pertinent surgical history.     Home Medications    Prior to Admission medications   Medication Sig Start Date End Date Taking? Authorizing Provider  albuterol (VENTOLIN HFA) 108 (90 Base) MCG/ACT inhaler Inhale 2 puffs into the lungs every 6 (six) hours as needed for wheezing or shortness of breath. 04/11/19  Yes Georgina Quint, MD  amphetamine-dextroamphetamine (ADDERALL) 20 MG tablet Take by mouth. 06/28/21 01/23/22 Yes [provider]  fluticasone furoate-vilanterol (BREO ELLIPTA) 100-25 MCG/INH AEPB Inhale 1 puff into the lungs daily.   Yes [provider]  fluticasone-salmeterol (ADVAIR HFA) 45-21 MCG/ACT inhaler Inhale 2 puffs into the lungs 2 (two) times daily. 10/09/20  Yes [provider]  Multiple Vitamin (MULTI VITAMIN DAILY PO) Take by mouth daily.   Yes [provider]  mupirocin ointment (BACTROBAN) 2 % Apply 1 application topically 2 (two) times daily. 07/11/21  Yes Particia Nearing, PA-C  ondansetron (ZOFRAN ODT) 4 MG disintegrating tablet Take 1 tablet (4 mg total) by mouth every 8 (eight) hours as needed for nausea or vomiting. 03/15/21  Yes Mickie Bail, NP    Family History Family History  Problem  Relation Age of Onset   Anxiety disorder Mother    Cancer Maternal Grandfather        brain    Social History Social History   Tobacco Use   Smoking status: Never   Smokeless tobacco: Never  Substance Use Topics   Alcohol use: Yes    Alcohol/week: 0.0 standard drinks    Comment: slightly-wine   Drug use: No     Allergies   Motrin [ibuprofen]   Review of Systems Review of Systems Per HPI  Physical Exam Triage Vital Signs ED Triage Vitals [01/23/22 0819]  Enc Vitals Group     BP 104/71     Pulse Rate 92     Resp 18     Temp 98.3 F (36.8 C)     Temp Source Oral     SpO2 95 %     Weight 200 lb (90.7 kg)     Height 6' (1.829 m)     Head Circumference      Peak Flow      Pain Score 0     Pain Loc      Pain Edu?      Excl. in [place]?    No data found.  Updated Vital Signs BP 104/71 (BP Location: Left Arm)    Pulse 92    Temp 98.3 F (36.8 C) (Oral)    Resp 18    Ht 6' (1.829 m)    Wt 200 lb (90.7 kg)  SpO2 95%    BMI 27.12 kg/m   Visual Acuity Right Eye Distance:   Left Eye Distance:   Bilateral Distance:    Right Eye Near:   Left Eye Near:    Bilateral Near:     Physical Exam Constitutional:      General: He is not in acute distress.    Appearance: Normal appearance. He is not toxic-appearing or diaphoretic.  HENT:     Head: Normocephalic and atraumatic.     Right Ear: Tympanic membrane and ear canal normal.     Left Ear: Tympanic membrane and ear canal normal.     Nose: Congestion present.     Mouth/Throat:     Mouth: Mucous membranes are moist.     Pharynx: Posterior oropharyngeal erythema present.  Eyes:     Extraocular Movements: Extraocular movements intact.     Conjunctiva/sclera: Conjunctivae normal.     Pupils: Pupils are equal, round, and reactive to light.  Cardiovascular:     Rate and Rhythm: Normal rate and regular rhythm.     Pulses: Normal pulses.     Heart sounds: Normal heart sounds.  Pulmonary:     Effort: Pulmonary effort  is normal. No respiratory distress.     Breath sounds: Normal breath sounds. No stridor. No wheezing, rhonchi or rales.  Abdominal:     General: Abdomen is flat. Bowel sounds are normal.     Palpations: Abdomen is soft.  Musculoskeletal:        General: Normal range of motion.     Cervical back: Normal range of motion.  Lymphadenopathy:     Cervical: Cervical adenopathy present.     Right cervical: Superficial cervical adenopathy present.  Skin:    General: Skin is warm and dry.  Neurological:     General: No focal deficit present.     Mental Status: He is alert and oriented to person, place, and time. Mental status is at baseline.  Psychiatric:        Mood and Affect: Mood normal.        Behavior: Behavior normal.     UC Treatments / Results  Labs (all labs ordered are listed, but only abnormal results are displayed) Labs Reviewed  CULTURE, GROUP A STREP ([facility])  NOVEL CORONAVIRUS, NAA  POCT INFLUENZA A/B  POCT RAPID STREP A (OFFICE)    EKG   Radiology No results found.  Procedures Procedures (including critical care time)  Medications Ordered in UC Medications - No data to display  Initial Impression / Assessment and Plan / UC Course  I have reviewed the triage vital signs and the nursing notes.  Pertinent labs & imaging results that were available during my care of the patient were reviewed by me and considered in my medical decision making (see chart for details).     Patient's symptoms appear viral in etiology.  Rapid flu and rapid strep were negative.  COVID-19 viral swab and throat culture pending.  Discussed supportive care and symptom management with patient.  Discussed return precautions.  Patient verbalized understanding and was agreeable with plan. Final Clinical Impressions(s) / UC Diagnoses   Final diagnoses:  Viral illness  Sore throat     Discharge Instructions      It appears that you have a viral illness that should self resolve in the  next few days.  Rapid flu and rapid strep are negative.  Throat culture and COVID test are pending.  We will call if they are positive.  Please follow-up if symptoms persist or worsen.     ED Prescriptions   None    PDMP not reviewed this encounter.   Gustavus Bryant, [place] 01/23/22 [phone]

## 2022-01-24 LAB — NOVEL CORONAVIRUS, NAA: SARS-CoV-2, NAA: NOT DETECTED

## 2022-01-26 LAB — CULTURE, GROUP A STREP (THRC)

## 2022-10-07 ENCOUNTER — Ambulatory Visit
Admission: EM | Admit: 2022-10-07 | Discharge: 2022-10-07 | Disposition: A | Discharge status: Home / Self Care | Payer: PRIVATE HEALTH INSURANCE | Attending: Physician Assistant | Admitting: Physician Assistant

## 2022-10-07 DIAGNOSIS — U071 COVID-19: Secondary | ICD-10-CM

## 2022-10-07 DIAGNOSIS — R051 Acute cough: Secondary | ICD-10-CM

## 2022-10-07 DIAGNOSIS — J209 Acute bronchitis, unspecified: Secondary | ICD-10-CM

## 2022-10-07 DIAGNOSIS — J069 Acute upper respiratory infection, unspecified: Secondary | ICD-10-CM

## 2022-10-07 LAB — POCT RAPID STREP A (OFFICE): Rapid Strep A Screen: NEGATIVE

## 2022-10-07 MED ORDER — CEPHALEXIN 250 MG/5ML PO SUSR: 500.0000 mg | Freq: Two times a day (BID) | ORAL | 0 refills | Status: DC

## 2022-10-07 MED ORDER — PROMETHAZINE-DM 6.25-15 MG/5ML PO SYRP: 5.0000 mL | ORAL_SOLUTION | Freq: Four times a day (QID) | ORAL | 0 refills | Status: DC | PRN

## 2022-10-07 MED ORDER — FLUTICASONE-SALMETEROL 45-21 MCG/ACT IN AERO: 2.0000 | INHALATION_SPRAY | Freq: Two times a day (BID) | RESPIRATORY_TRACT | 2 refills | Status: DC

## 2022-10-07 MED ORDER — CEPHALEXIN 250 MG/5ML PO SUSR: 500.0000 mg | Freq: Two times a day (BID) | ORAL | 0 refills | Status: AC

## 2022-10-07 MED ORDER — FLUTICASONE-SALMETEROL 45-21 MCG/ACT IN AERO: 2.0000 | INHALATION_SPRAY | Freq: Two times a day (BID) | RESPIRATORY_TRACT | 2 refills | Status: AC

## 2022-10-07 NOTE — ED Triage Notes (Signed)
Pt presents to [facility] with co of increased sore throat, otalgia. Pt reports he was at er for tachycardia and was diagnosed with Covid. Pt reports he has had fever. Pt  reports ph of asthma and has been having increased sob and pressure. Pt reports liquid tylenol for medication has difficulty taking pills.

## 2022-10-07 NOTE — Discharge Instructions (Addendum)
Advised to continue to use the albuterol inhaler, 2 puffs every 6 hours on a regular basis to help decrease the heaviness and shortness of breath. Advised to use the Phenergan DM 1 teaspoon every 6 hours on a regular basis to control congestion and cough. Advised to take the doxycycline 100 mg every 12 hours to treat respiratory infection. Advised to continue to increase fluid intake. Vies to follow-up PCP or return to urgent care if symptoms fail to improve.

## 2022-10-07 NOTE — ED Provider Notes (Signed)
[facility]    CSN: [id number] Arrival date & time: 10/07/22  3086      History   Chief Complaint Chief Complaint  Patient presents with   Sore Throat    HPI Joshua Santos is a 31 y.o. male.   31 year old male presents with sore throat, cough and chest heaviness.  Patient indicates that on Sunday he was seen at the emergency room [facility] in [place].  Patient indicates that he was having cough, congestion, fever, tachycardia, and heaviness in the chest.  Patient indicates that he had a COVID test done at the emergency room and it was positive.  He also indicates that he had a EKG performed and a chest x-ray and these were reported as being normal/negative.  Patient indicates that he was sent home without any medication.  He indicates he continues to have upper respiratory symptoms of sinus congestion, postnasal drip, rhinitis with sore throat and painful swallowing.  He indicates he is having fatigue, lethargy, chest congestion with intermittent cough purulent production and shortness of breath.  Patient indicates he has asthma however he is not having any wheezing.  He indicates he does have an albuterol inhaler that he uses when needed and also uses Advair inhaler as a preventive medication.  Patient also indicates he has Flonase nasal spray for upper respiratory congestion and allergies.  He indicates he is tolerating fluids well, mild nausea is present with his symptoms without any vomiting.   Sore Throat Associated symptoms include shortness of breath.    Past Medical History:  Diagnosis Date   ADHD    Asthma     There are no problems to display for this patient.   History reviewed. No pertinent surgical history.     Home Medications    Prior to Admission medications   Medication Sig Start Date End Date Taking? Authorizing Provider  albuterol (VENTOLIN HFA) 108 (90 Base) MCG/ACT inhaler Inhale 2 puffs into the lungs every 6 (six) hours as  needed for wheezing or shortness of breath. 04/11/19   Georgina Quint, MD  amphetamine-dextroamphetamine (ADDERALL) 20 MG tablet Take by mouth. 06/28/21 01/23/22  [provider]  cephALEXin (KEFLEX) 250 MG/5ML suspension Take 10 mLs (500 mg total) by mouth 2 (two) times daily for 7 days. 10/07/22 10/14/22  Ellsworth Lennox, PA-C  fluticasone furoate-vilanterol (BREO ELLIPTA) 100-25 MCG/INH AEPB Inhale 1 puff into the lungs daily.    [provider]  fluticasone-salmeterol (ADVAIR HFA) [phone] MCG/ACT inhaler Inhale 2 puffs into the lungs 2 (two) times daily. 10/07/22   Ellsworth Lennox, PA-C  Multiple Vitamin (MULTI VITAMIN DAILY PO) Take by mouth daily.    [provider]  mupirocin ointment (BACTROBAN) 2 % Apply 1 application topically 2 (two) times daily. 07/11/21   Particia Nearing, PA-C  ondansetron (ZOFRAN ODT) 4 MG disintegrating tablet Take 1 tablet (4 mg total) by mouth every 8 (eight) hours as needed for nausea or vomiting. 03/15/21   Mickie Bail, NP  promethazine-dextromethorphan (PROMETHAZINE-DM) 6.25-15 MG/5ML syrup Take 5 mLs by mouth 4 (four) times daily as needed for cough. 10/07/22   Ellsworth Lennox, PA-C    Family History Family History  Problem Relation Age of Onset   Anxiety disorder Mother    Cancer Maternal Grandfather        brain    Social History Social History   Tobacco Use   Smoking status: Never   Smokeless tobacco: Never  Substance Use Topics  Alcohol use: Yes    Alcohol/week: 0.0 standard drinks of alcohol    Comment: slightly-wine   Drug use: No     Allergies   Motrin [ibuprofen]   Review of Systems Review of Systems  HENT:  Positive for postnasal drip and sore throat.   Respiratory:  Positive for chest tightness and shortness of breath.      Physical Exam Triage Vital Signs ED Triage Vitals  Enc Vitals Group     BP 10/07/22 1022 107/71     Pulse Rate 10/07/22 1022 70     Resp 10/07/22 1022 18     Temp 10/07/22  1022 (!) 97.4 F (36.3 C)     Temp src --      SpO2 10/07/22 1022 98 %     Weight --      Height --      Head Circumference --      Peak Flow --      Pain Score 10/07/22 1021 5     Pain Loc --      Pain Edu? --      Excl. in [place]? --    No data found.  Updated Vital Signs BP 107/71   Pulse 70   Temp (!) 97.4 F (36.3 C)   Resp 18   SpO2 98%   Visual Acuity Right Eye Distance:   Left Eye Distance:   Bilateral Distance:    Right Eye Near:   Left Eye Near:    Bilateral Near:     Physical Exam Constitutional:      Appearance: He is well-developed.  HENT:     Right Ear: Ear canal normal. Tympanic membrane is injected.     Left Ear: Ear canal normal. Tympanic membrane is injected.     Mouth/Throat:     Mouth: Mucous membranes are moist.     Pharynx: Posterior oropharyngeal erythema present. No oropharyngeal exudate.  Cardiovascular:     Rate and Rhythm: Normal rate and regular rhythm.     Heart sounds: Normal heart sounds.  Pulmonary:     Effort: Pulmonary effort is normal.     Breath sounds: Normal air entry. Rhonchi (moderate) present. No rales.  Lymphadenopathy:     Cervical: No cervical adenopathy.  Neurological:     Mental Status: He is alert.      UC Treatments / Results  Labs (all labs ordered are listed, but only abnormal results are displayed) Labs Reviewed  POCT RAPID STREP A (OFFICE)    EKG   Radiology No results found.  Procedures Procedures (including critical care time)  Medications Ordered in UC Medications - No data to display  Initial Impression / Assessment and Plan / UC Course  I have reviewed the triage vital signs and the nursing notes.  Pertinent labs & imaging results that were available during my care of the patient were reviewed by me and considered in my medical decision making (see chart for details).    Plan: 1.  The upper respiratory tract infection will be treated with the following: A.  Patient advised to  continue to use the Flonase nasal spray, 2 sprays each nostril once daily to help control congestion. B.  Phenergan DM, 1 teaspoon every 6-8 hours as needed for cough and congestion. 2.  The acute cough will be treated with the following: A.  Phenergan DM, 1 to 2 teaspoons every 6-8 hours as needed for cough. 3. Acute bronchitis will be treated with the following: A.  Patient advised to use the albuterol inhaler, 2 puffs every 6 hours on a regular basis to help decrease wheezing and shortness of breath. B.  Patient advised to restart his Advair inhaler, 1 puff every 12 hours as a preventive measure. C.  Advised to use the Phenergan DM every 6 hours to help control cough and congestion. D.  Doxycycline 100 mg every 12 hours to treat the respiratory infection. 4.  COVID will be treated with the following: A.  Symptomatic treatment and observation and report to PCP if symptoms fail to improve over the next week. 5.  Return to urgent care as needed. Final Clinical Impressions(s) / UC Diagnoses   Final diagnoses:  Acute upper respiratory infection  COVID  Acute cough  Acute bronchitis, unspecified organism     Discharge Instructions      Advised to continue to use the albuterol inhaler, 2 puffs every 6 hours on a regular basis to help decrease the heaviness and shortness of breath. Advised to use the Phenergan DM 1 teaspoon every 6 hours on a regular basis to control congestion and cough. Advised to take the doxycycline 100 mg every 12 hours to treat respiratory infection. Advised to continue to increase fluid intake. Vies to follow-up PCP or return to urgent care if symptoms fail to improve.    ED Prescriptions     Medication Sig Dispense Auth. Provider   fluticasone-salmeterol (ADVAIR HFA) 45-21 MCG/ACT inhaler  (Status: Discontinued) Inhale 2 puffs into the lungs 2 (two) times daily. 1 each Ellsworth Lennox, PA-C   promethazine-dextromethorphan (PROMETHAZINE-DM) 6.25-15 MG/5ML syrup   (Status: Discontinued) Take 5 mLs by mouth 4 (four) times daily as needed for cough. 118 mL Ellsworth Lennox, PA-C   cephALEXin [facility]) 250 MG/5ML suspension  (Status: Discontinued) Take 10 mLs (500 mg total) by mouth 2 (two) times daily for 7 days. 140 mL Ellsworth Lennox, PA-C   cephALEXin [facility]) 250 MG/5ML suspension Take 10 mLs (500 mg total) by mouth 2 (two) times daily for 7 days. 140 mL Ellsworth Lennox, PA-C   fluticasone-salmeterol (ADVAIR HFA) [phone] MCG/ACT inhaler Inhale 2 puffs into the lungs 2 (two) times daily. 1 each Ellsworth Lennox, PA-C   promethazine-dextromethorphan (PROMETHAZINE-DM) 6.25-15 MG/5ML syrup Take 5 mLs by mouth 4 (four) times daily as needed for cough. 118 mL Ellsworth Lennox, PA-C      PDMP not reviewed this encounter.   Ellsworth Lennox, PA-C 10/07/22 1111

## 2023-01-19 ENCOUNTER — Ambulatory Visit
Admission: EM | Admit: 2023-01-19 | Discharge: 2023-01-19 | Disposition: A | Discharge status: Home / Self Care | Payer: PRIVATE HEALTH INSURANCE | Attending: Family Medicine | Admitting: Family Medicine

## 2023-01-19 DIAGNOSIS — T148XXA Other injury of unspecified body region, initial encounter: Secondary | ICD-10-CM

## 2023-01-19 MED ORDER — CYCLOBENZAPRINE HCL 5 MG PO TABS: 5.0000 mg | ORAL_TABLET | Freq: Every day | ORAL | 0 refills | Status: DC

## 2023-01-19 NOTE — ED Provider Notes (Signed)
[facility]    CSN: [id number] Arrival date & time: 01/19/23  0804      History   Chief Complaint Chief Complaint  Patient presents with   Leg Pain    HPI Joshua Santos is a 32 y.o. male.   Patient is here for bilateral medial thigh pain.   That started yesterday.  He woke up with it. Painful to move the legs, esp laterally/medially.  He went to the gym the day before, did his usual exercises.  Did not have any injury at the gym, no pain when he went to bed.  Today is the same or worse compared to yesterday.  No urinary issues/symptoms.  No penile d/c noted.  No fevers/chills.  No swelling that he is aware of.  No meds taken       Past Medical History:  Diagnosis Date   ADHD    Asthma     There are no problems to display for this patient.   History reviewed. No pertinent surgical history.     Home Medications    Prior to Admission medications   Medication Sig Start Date End Date Taking? Authorizing Provider  busPIRone (BUSPAR) 10 MG tablet Take by mouth. 10/29/22  Yes [provider]  hydrOXYzine (ATARAX) 25 MG tablet Take 1-2 tablets bid prn for panic attacks. 11/19/22  Yes [provider]  albuterol (VENTOLIN HFA) 108 (90 Base) MCG/ACT inhaler Inhale 2 puffs into the lungs every 6 (six) hours as needed for wheezing or shortness of breath. 04/11/19   Horald Pollen, MD  amphetamine-dextroamphetamine (ADDERALL) 20 MG tablet Take by mouth. 06/28/21 01/23/22  [provider]  fluticasone furoate-vilanterol (BREO ELLIPTA) 100-25 MCG/INH AEPB Inhale 1 puff into the lungs daily.    [provider]  fluticasone-salmeterol (ADVAIR HFA) [phone] MCG/ACT inhaler Inhale 2 puffs into the lungs 2 (two) times daily. 10/07/22   Nyoka Lint, PA-C  Multiple Vitamin (MULTI VITAMIN DAILY PO) Take by mouth daily.    [provider]  mupirocin ointment (BACTROBAN) 2 % Apply 1 application topically 2 (two) times daily.  07/11/21   Volney American, PA-C  ondansetron (ZOFRAN ODT) 4 MG disintegrating tablet Take 1 tablet (4 mg total) by mouth every 8 (eight) hours as needed for nausea or vomiting. 03/15/21   Sharion Balloon, NP  promethazine-dextromethorphan (PROMETHAZINE-DM) 6.25-15 MG/5ML syrup Take 5 mLs by mouth 4 (four) times daily as needed for cough. 10/07/22   Nyoka Lint, PA-C    Family History Family History  Problem Relation Age of Onset   Anxiety disorder Mother    Cancer Maternal Grandfather        brain    Social History Social History   Tobacco Use   Smoking status: Never   Smokeless tobacco: Never  Substance Use Topics   Alcohol use: Yes    Alcohol/week: 0.0 standard drinks of alcohol    Comment: slightly-wine   Drug use: No     Allergies   Motrin [ibuprofen]   Review of Systems Review of Systems  Constitutional: Negative.   HENT: Negative.    Respiratory: Negative.    Cardiovascular: Negative.   Gastrointestinal: Negative.   Musculoskeletal:  Positive for myalgias.     Physical Exam Triage Vital Signs ED Triage Vitals [01/19/23 0852]  Enc Vitals Group     BP 130/78     Pulse Rate 74     Resp 16     Temp 98.5 F (36.9 C)  Temp Source Oral     SpO2 97 %     Weight      Height      Head Circumference      Peak Flow      Pain Score 6     Pain Loc      Pain Edu?      Excl. in [place]?    No data found.  Updated Vital Signs BP 130/78 (BP Location: Left Arm)   Pulse 74   Temp 98.5 F (36.9 C) (Oral)   Resp 16   SpO2 97%   Visual Acuity Right Eye Distance:   Left Eye Distance:   Bilateral Distance:    Right Eye Near:   Left Eye Near:    Bilateral Near:     Physical Exam Constitutional:      Appearance: Normal appearance.  Cardiovascular:     Rate and Rhythm: Normal rate.  Pulmonary:     Effort: Pulmonary effort is normal.  Abdominal:     Palpations: Abdomen is soft.     Tenderness: There is abdominal tenderness. There is rebound. There  is no guarding.  Musculoskeletal:     Comments: TTP to the upper thighs bilaterally, esp at the medial thighs;  no swelling or erythema noted;  full rom of the legs but pain with all movement;  no weakness noted;   Skin:    General: Skin is warm.  Neurological:     General: No focal deficit present.     Mental Status: He is alert.      UC Treatments / Results  Labs (all labs ordered are listed, but only abnormal results are displayed) Labs Reviewed - No data to display  EKG   Radiology No results found.  Procedures Procedures (including critical care time)  Medications Ordered in UC Medications - No data to display  Initial Impression / Assessment and Plan / UC Course  I have reviewed the triage vital signs and the nursing notes.  Pertinent labs & imaging results that were available during my care of the patient were reviewed by me and considered in my medical decision making (see chart for details).   Final Clinical Impressions(s) / UC Diagnoses   Final diagnoses:  Muscle strain     Discharge Instructions      You were seen today for leg pain.  I think this is muscular in nature.  I recommend you take motrin 68m three times/day for pain.  I have sent out a muscle relaxer to take at night as well.  You may use a heating pad or ice for comfort.  This should improve over the next several days.  I would avoid work outs that  cause pain for now.     ED Prescriptions     Medication Sig Dispense Auth. Provider   cyclobenzaprine (FLEXERIL) 5 MG tablet Take 1 tablet (5 mg total) by mouth at bedtime. 15 tablet PRondel Oh MD      PDMP not reviewed this encounter.   PRondel Oh MD 01/19/23 [phone]

## 2023-01-19 NOTE — ED Triage Notes (Signed)
Pt c/o bilateral hip/thigh/groin pain onset ~ 2 days ago states he was recently doing intense leg workouts at the gym.

## 2023-01-19 NOTE — Discharge Instructions (Signed)
You were seen today for leg pain.  I think this is muscular in nature.  I recommend you take motrin 666m three times/day for pain.  I have sent out a muscle relaxer to take at night as well.  You may use a heating pad or ice for comfort.  This should improve over the next several days.  I would avoid work outs that  cause pain for now.

## 2023-04-27 ENCOUNTER — Emergency Department (HOSPITAL_BASED_OUTPATIENT_CLINIC_OR_DEPARTMENT_OTHER): Payer: No Typology Code available for payment source

## 2023-04-27 ENCOUNTER — Ambulatory Visit
Admission: EM | Admit: 2023-04-27 | Discharge: 2023-04-27 | Disposition: A | Discharge status: Home / Self Care | Payer: 59

## 2023-04-27 ENCOUNTER — Other Ambulatory Visit (HOSPITAL_BASED_OUTPATIENT_CLINIC_OR_DEPARTMENT_OTHER): Payer: Self-pay

## 2023-04-27 ENCOUNTER — Encounter (HOSPITAL_BASED_OUTPATIENT_CLINIC_OR_DEPARTMENT_OTHER): Payer: Self-pay

## 2023-04-27 ENCOUNTER — Other Ambulatory Visit: Payer: Self-pay

## 2023-04-27 ENCOUNTER — Emergency Department (HOSPITAL_BASED_OUTPATIENT_CLINIC_OR_DEPARTMENT_OTHER)
Admission: EM | Admit: 2023-04-27 | Discharge: 2023-04-27 | Disposition: A | Discharge status: Home / Self Care | Payer: No Typology Code available for payment source | Attending: Emergency Medicine | Admitting: Emergency Medicine

## 2023-04-27 DIAGNOSIS — S161XXA Strain of muscle, fascia and tendon at neck level, initial encounter: Secondary | ICD-10-CM | POA: Insufficient documentation

## 2023-04-27 DIAGNOSIS — Z23 Encounter for immunization: Secondary | ICD-10-CM | POA: Diagnosis not present

## 2023-04-27 DIAGNOSIS — I959 Hypotension, unspecified: Secondary | ICD-10-CM

## 2023-04-27 DIAGNOSIS — Z7951 Long term (current) use of inhaled steroids: Secondary | ICD-10-CM | POA: Insufficient documentation

## 2023-04-27 DIAGNOSIS — S199XXA Unspecified injury of neck, initial encounter: Secondary | ICD-10-CM | POA: Diagnosis present

## 2023-04-27 DIAGNOSIS — S61212A Laceration without foreign body of right middle finger without damage to nail, initial encounter: Secondary | ICD-10-CM

## 2023-04-27 DIAGNOSIS — S0081XA Abrasion of other part of head, initial encounter: Secondary | ICD-10-CM | POA: Insufficient documentation

## 2023-04-27 DIAGNOSIS — S0990XA Unspecified injury of head, initial encounter: Secondary | ICD-10-CM

## 2023-04-27 DIAGNOSIS — W231XXA Caught, crushed, jammed, or pinched between stationary objects, initial encounter: Secondary | ICD-10-CM | POA: Diagnosis not present

## 2023-04-27 DIAGNOSIS — J45909 Unspecified asthma, uncomplicated: Secondary | ICD-10-CM | POA: Insufficient documentation

## 2023-04-27 DIAGNOSIS — R55 Syncope and collapse: Secondary | ICD-10-CM | POA: Insufficient documentation

## 2023-04-27 DIAGNOSIS — Y990 Civilian activity done for income or pay: Secondary | ICD-10-CM | POA: Diagnosis not present

## 2023-04-27 HISTORY — DX: Syncope and collapse: R55

## 2023-04-27 LAB — CBC
HCT: 40 % (ref 39.0–52.0)
Hemoglobin: 14.1 g/dL (ref 13.0–17.0)
MCH: 31.3 pg (ref 26.0–34.0)
MCHC: 35.3 g/dL (ref 30.0–36.0)
MCV: 88.9 fL (ref 80.0–100.0)
Platelets: 214 10*3/uL (ref 150–400)
RBC: 4.5 MIL/uL (ref 4.22–5.81)
RDW: 11.4 % — ABNORMAL LOW (ref 11.5–15.5)
WBC: 7.9 10*3/uL (ref 4.0–10.5)
nRBC: 0 % (ref 0.0–0.2)

## 2023-04-27 LAB — BASIC METABOLIC PANEL
Anion gap: 7 (ref 5–15)
BUN: 15 mg/dL (ref 6–20)
CO2: 27 mmol/L (ref 22–32)
Calcium: 8.7 mg/dL — ABNORMAL LOW (ref 8.9–10.3)
Chloride: 104 mmol/L (ref 98–111)
Creatinine, Ser: 1.03 mg/dL (ref 0.61–1.24)
GFR, Estimated: >60 mL/min (ref >60)
Glucose, Bld: 99 mg/dL (ref 70–99)
Potassium: 3.8 mmol/L (ref 3.5–5.1)
Sodium: 138 mmol/L (ref 135–145)

## 2023-04-27 LAB — CBG MONITORING, ED: Glucose-Capillary: 98 mg/dL (ref 70–99)

## 2023-04-27 MED ORDER — CYCLOBENZAPRINE HCL 10 MG PO TABS: 10.0000 mg | ORAL_TABLET | Freq: Three times a day (TID) | ORAL | 0 refills | Status: AC | PRN

## 2023-04-27 MED ORDER — TETANUS-DIPHTH-ACELL PERTUSSIS 5-2.5-18.5 LF-MCG/0.5 IM SUSY
0.5000 mL | PREFILLED_SYRINGE | Freq: Once | INTRAMUSCULAR | Status: AC
  Administered 2023-04-27: 0.5 mL via INTRAMUSCULAR
  Filled 2023-04-27: qty 0.5

## 2023-04-27 MED ORDER — LIDOCAINE 5 % EX PTCH: 1.0000 | MEDICATED_PATCH | CUTANEOUS | 0 refills | Status: AC

## 2023-04-27 MED ORDER — LACTATED RINGERS IV BOLUS
1000.0000 mL | Freq: Once | INTRAVENOUS | Status: AC
  Administered 2023-04-27: 1000 mL via INTRAVENOUS

## 2023-04-27 NOTE — ED Triage Notes (Signed)
Pt c/o cutting right 3rd digit at work om a metal rod today at work resulting in pt passing out and hitting head states his neck hurts and "my face is jacked up."

## 2023-04-27 NOTE — ED Notes (Signed)
NP assessed patient after triage. NP inserted IV first attempt no complications. EMS contacted by CMA on site. 1L NS to gravity initiated ran < 30 mins before EMS transported patient. NP remained at bedside until EMS assumed care.

## 2023-04-27 NOTE — ED Notes (Signed)
Pt reported no dizziness while performing orthostatic VS.

## 2023-04-27 NOTE — ED Provider Notes (Signed)
[facility]    CSN: [id number] Arrival date & time: 04/27/23  1116      History   Chief Complaint Chief Complaint  Patient presents with   Laceration    HPI Joshua Santos is a 32 y.o. male.   Patient presents to urgent care for evaluation of laceration of the pad of the right middle finger at the DIP joint that happened today while he was at work. Patient was walking without paying attention and got his right middle finger jammed in a metal structure (image below). He noticed blood from the finger as a result of laceration and became dizzy. He walked to his desk, placed his head on his desk while seated in a rolling desk chair, and woke up on the floor face down. Unsure of how long he was unconscious for but believes it was minutes. EMS was called and patient was evaluated while he was at work but did not go to the hospital.  Reports bilateral posterior neck pain after fall and states this happened the last time that he had a syncopal episode with head injury after blood draw.  He was found to have a blood sugar of 110s, states he ate a sausage biscuit this morning for breakfast.  History of asthma, no history of diabetes or other chronic medical problems. Patient was able to drive himself to urgent care for evaluation, however became dizzy and with "white vision/spiderweb vision" after taking his Band-Aid off from his finger to see the laceration while in exam room. Denies chest pain, shortness of breath, headache, and abdominal pain. Denies use of chronic anticoagulation.  Reports history of vasovagal syncope with the site of blood and with blood draws.  He has not had any further syncopal episodes since initial syncopal event while at work.      Past Medical History:  Diagnosis Date   ADHD    Asthma    Syncopal episodes     There are no problems to display for this patient.   History reviewed. No pertinent surgical history.     Home Medications    Prior to  Admission medications   Medication Sig Start Date End Date Taking? Authorizing Provider  albuterol (VENTOLIN HFA) 108 (90 Base) MCG/ACT inhaler Inhale 2 puffs into the lungs every 6 (six) hours as needed for wheezing or shortness of breath. 04/11/19   Georgina Quint, MD  cyclobenzaprine (FLEXERIL) 10 MG tablet Take 1 tablet (10 mg total) by mouth 3 (three) times daily as needed for up to 5 days for muscle spasms. 04/27/23 05/02/23  Gowens, Mariah L, PA-C  fluticasone furoate-vilanterol (BREO ELLIPTA) 100-25 MCG/INH AEPB Inhale 1 puff into the lungs daily.    [provider]  fluticasone-salmeterol (ADVAIR HFA) [phone] MCG/ACT inhaler Inhale 2 puffs into the lungs 2 (two) times daily. 10/07/22   Ellsworth Lennox, PA-C  lidocaine (LIDODERM) 5 % Place 1 patch onto the skin daily for 5 days. Remove & Discard patch within 12 hours or as directed by MD 04/27/23 05/02/23  Ysidro Evert L, PA-C  Multiple Vitamin (MULTI VITAMIN DAILY PO) Take by mouth daily.    [provider]    Family History Family History  Problem Relation Age of Onset   Anxiety disorder Mother    Cancer Maternal Grandfather        brain    Social History Social History   Tobacco Use   Smoking status: Never   Smokeless tobacco: Never  Substance Use  Topics   Alcohol use: Yes    Alcohol/week: 0.0 standard drinks of alcohol    Comment: slightly-wine   Drug use: No     Allergies   Banana and Motrin [ibuprofen]   Review of Systems Review of Systems Per HPI  Physical Exam Triage Vital Signs ED Triage Vitals  Enc Vitals Group     BP 04/27/23 1141 97/64     Pulse Rate 04/27/23 1140 69     Resp 04/27/23 1140 16     Temp 04/27/23 1140 (!) 97 F (36.1 C)     Temp Source 04/27/23 1140 Oral     SpO2 04/27/23 1140 96 %     Weight --      Height --      Head Circumference --      Peak Flow --      Pain Score 04/27/23 1141 6     Pain Loc --      Pain Edu? --      Excl. in GC? --    No data  found.  Updated Vital Signs BP 110/70 (BP Location: Left Arm)   Pulse 69   Temp (!) 97 F (36.1 C) (Oral)   Resp 16   SpO2 96%   Visual Acuity Right Eye Distance:   Left Eye Distance:   Bilateral Distance:    Right Eye Near:   Left Eye Near:    Bilateral Near:     Physical Exam Vitals and nursing note reviewed.  Constitutional:      Appearance: He is not ill-appearing or toxic-appearing.  HENT:     Head: Normocephalic. Abrasion present.     Jaw: There is normal jaw occlusion.     Comments: Multiple abrasions to the face as a result of injury, see images below for details.     Right Ear: Hearing and external ear normal.     Left Ear: Hearing and external ear normal.     Nose: Nose normal.     Mouth/Throat:     Lips: Pink.     Mouth: Mucous membranes are moist.  Eyes:     General: Lids are normal. Vision grossly intact. Gaze aligned appropriately.     Extraocular Movements: Extraocular movements intact.     Conjunctiva/sclera: Conjunctivae normal.     Pupils: Pupils are equal, round, and reactive to light.  Cardiovascular:     Rate and Rhythm: Normal rate and regular rhythm.     Heart sounds: Normal heart sounds, S1 normal and S2 normal.  Pulmonary:     Effort: Pulmonary effort is normal. No respiratory distress.     Breath sounds: Normal breath sounds and air entry. No stridor. No wheezing, rhonchi or rales.  Chest:     Chest wall: No tenderness.  Musculoskeletal:     Cervical back: Normal range of motion and neck supple. Tenderness (tender to multiple points over the bilateral trapezius muscles) present. Muscular tenderness present. No spinous process tenderness.     Thoracic back: Normal.     Lumbar back: Normal.  Skin:    General: Skin is warm and dry.     Capillary Refill: Capillary refill takes less than 2 seconds.     Findings: Abrasion and laceration (laceration to the pad of the right middle finger at the DIP joint as seen in image below) present. No rash.      Comments: Multiple abrasions to the face.   Neurological:     General: No focal deficit present.  Mental Status: He is alert and oriented to person, place, and time. Mental status is at baseline.     Cranial Nerves: No cranial nerve deficit, dysarthria or facial asymmetry.     Sensory: No sensory deficit.     Motor: No weakness.     Coordination: Coordination normal.     Gait: Gait normal.  Psychiatric:        Mood and Affect: Mood normal.        Speech: Speech normal.        Behavior: Behavior normal.        Thought Content: Thought content normal.        Judgment: Judgment normal.     Right index finger laceration   Facial abrasions   Structure that finger was caught on causing laceration    UC Treatments / Results  Labs (all labs ordered are listed, but only abnormal results are displayed) Labs Reviewed - No data to display  EKG   Radiology No results found.  Procedures Procedures (including critical care time)  Medications Ordered in UC Medications - No data to display  Initial Impression / Assessment and Plan / UC Course  I have reviewed the triage vital signs and the nursing notes.  Pertinent labs & imaging results that were available during my care of the patient were reviewed by me and considered in my medical decision making (see chart for details).   1. Vasovagal syncope, laceration of right middle finger without foreign body without damage to nail, hypotension Patient likely suffered vasovagal syncope at work due to sight of blood but was able to drive himself to the urgent care. On initial assessment prior to taking bandage off of laceration, patient was neurologically intact to his baseline. However, after bandage was taken off, he became dizzy, lightheaded, and vision became "white and spider webby" while in exam room at urgent care. BP dropped to 60s/40s, heart rate remained stable in the 50s-60s, and patient never fully lost consciousness.  Patient was placed in supine position. IV placed by provider and fluid bolus started. Patient remained hypotensive for about 10 minutes and never fully passed out, however likely experienced another vasovagal response.   Given syncope at work and that he remains symptomatic, I recommend transfer to ED via EMS for further workup and evaluation. Discussed recommendations with patient who is agreeable with plan. Patient discharged safely from UC in stable condition with [facility].   Final Clinical Impressions(s) / UC Diagnoses   Final diagnoses:  Vasovagal syncope  Laceration of right middle finger without foreign body without damage to nail, initial encounter  Hypotension, unspecified hypotension type   Discharge Instructions   None    ED Prescriptions   None    PDMP not reviewed this encounter.   Reita May [place], [place] 05/02/23 [phone]

## 2023-04-27 NOTE — ED Notes (Signed)
RN reviewed discharge instructions with pt. Pt verbalized understanding and had no further questions. VSS upon discharge.  

## 2023-04-27 NOTE — ED Notes (Signed)
Patient transported to CT 

## 2023-04-27 NOTE — ED Notes (Signed)
911 called for patient transport to ED

## 2023-04-27 NOTE — ED Triage Notes (Addendum)
Patient arrives with complaints of syncopal episode at work. EMS states that the patient cut his finger earlier today at work. Patient then had a syncopal episode at work related to seeing blood on his cut. EMS came to his job and evaluated the patient.    He then drove to an Urgent Care where he had another syncopal episode when they unwrapped his laceration. Sent here for stitches to hand and evaluation due to having 2 syncopal episodes.   Patient arrives alert and oriented x4. Abrasions to face, patient in a c-collar as well.   Given 650cc of saline at Urgent Care. HR 60 128/74  CBG at scene 100

## 2023-04-27 NOTE — ED Provider Notes (Signed)
[place] EMERGENCY DEPARTMENT AT [facility] Provider Note   CSN: [id number] Arrival date & time: 04/27/23  1322     History  Chief Complaint  Patient presents with   Loss of Consciousness   Laceration    Right hand, work Injury    Work Related Injury    Joshua Santos is a 32 y.o. male with past medical history anxiety and asthma who presents to the ED from urgent care for syncopal episodes.  Patient was at work today when he accidentally caught his right middle finger on the metal rack sustaining a small laceration.  After this injury, he looked at his finger and noticed bleeding and had a syncopal episode.  He presented to the urgent care for evaluation where after unwrapping the finger and seeing blood again he had another syncopal episode.  He states that this has happened in the past.  He does not take anticoagulants.  No large amounts of bleeding.  No other injury.  Following syncopal episodes, he fell down and injured his face.  He complains of some posterior neck pain from this fall.  Last tetanus greater than 10 years ago.  He has been eating and drinking normally.  No recent fever, chills, vision changes, nausea, vomiting, diarrhea, or other complaints today.      Home Medications Prior to Admission medications   Medication Sig Start Date End Date Taking? Authorizing Provider  cyclobenzaprine (FLEXERIL) 10 MG tablet Take 1 tablet (10 mg total) by mouth 3 (three) times daily as needed for up to 5 days for muscle spasms. 04/27/23 05/02/23 Yes Cayleen Benjamin L, PA-C  lidocaine (LIDODERM) 5 % Place 1 patch onto the skin daily for 5 days. Remove & Discard patch within 12 hours or as directed by MD 04/27/23 05/02/23 Yes Liliana Dang L, PA-C  albuterol (VENTOLIN HFA) 108 (90 Base) MCG/ACT inhaler Inhale 2 puffs into the lungs every 6 (six) hours as needed for wheezing or shortness of breath. 04/11/19   Georgina Quint, MD  fluticasone furoate-vilanterol (BREO ELLIPTA)  100-25 MCG/INH AEPB Inhale 1 puff into the lungs daily.    [provider]  fluticasone-salmeterol (ADVAIR HFA) [phone] MCG/ACT inhaler Inhale 2 puffs into the lungs 2 (two) times daily. 10/07/22   Ellsworth Lennox, PA-C  Multiple Vitamin (MULTI VITAMIN DAILY PO) Take by mouth daily.    [provider]      Allergies    Motrin [ibuprofen]    Review of Systems   Review of Systems  All other systems reviewed and are negative.   Physical Exam Updated Vital Signs BP 114/70   Pulse (!) 53   Temp 98.6 F (37 C) (Oral)   Resp 19   Ht 6' (1.829 m)   Wt 90.7 kg   SpO2 98%   BMI 27.12 kg/m  Physical Exam Vitals and nursing note reviewed.  Constitutional:      General: He is not in acute distress.    Appearance: Normal appearance.  HENT:     Head: Normocephalic.     Comments: Multiple superficial circular abrasions over the face, nose is stable, no tenderness over the facial bones    Right Ear: External ear normal.     Left Ear: External ear normal.     Mouth/Throat:     Mouth: Mucous membranes are moist.  Eyes:     General: No scleral icterus.    Extraocular Movements: Extraocular movements intact.     Conjunctiva/sclera: Conjunctivae normal.  Neck:  Comments: Mild tenderness over the trapezius muscles bilaterally, no midline cervical tenderness, step-offs or deformities, patient placed in c-collar pending imaging Cardiovascular:     Rate and Rhythm: Normal rate and regular rhythm.     Heart sounds: No murmur heard. Pulmonary:     Effort: Pulmonary effort is normal. No respiratory distress.     Breath sounds: Normal breath sounds. No stridor. No wheezing, rhonchi or rales.  Chest:     Chest wall: No tenderness.  Abdominal:     General: Abdomen is flat. There is no distension.     Palpations: Abdomen is soft.     Tenderness: There is no abdominal tenderness. There is no right CVA tenderness, left CVA tenderness, guarding or rebound.  Musculoskeletal:         General: No deformity. Normal range of motion.     Cervical back: Neck supple. No rigidity.     Right lower leg: No edema.     Left lower leg: No edema.     Comments: No midline TL spinal tenderness, stepoffs, or deformities; superficial linear laceration to distal right third finger, no active bleeding, wound edges fully approximated  Skin:    General: Skin is warm and dry.     Capillary Refill: Capillary refill takes less than 2 seconds.  Neurological:     General: No focal deficit present.     Mental Status: He is alert and oriented to person, place, and time.     GCS: GCS eye subscore is 4. GCS verbal subscore is 5. GCS motor subscore is 6.     Cranial Nerves: Cranial nerves 2-12 are intact. No cranial nerve deficit, dysarthria or facial asymmetry.     Sensory: Sensation is intact.     Motor: Motor function is intact. No weakness, tremor, atrophy, abnormal muscle tone or seizure activity.     Coordination: Coordination is intact.     Gait: Gait is intact.  Psychiatric:        Behavior: Behavior normal.     ED Results / Procedures / Treatments   Labs (all labs ordered are listed, but only abnormal results are displayed) Labs Reviewed  BASIC METABOLIC PANEL - Abnormal; Notable for the following components:      Result Value   Calcium 8.7 (*)    All other components within normal limits  CBC - Abnormal; Notable for the following components:   RDW 11.4 (*)    All other components within normal limits  CBG MONITORING, ED  CBG MONITORING, ED    EKG EKG Interpretation  Date/Time:  Monday Apr 27 2023 13:32:16 EDT Ventricular Rate:  51 PR Interval:  135 QRS Duration: 107 QT Interval:  426 QTC Calculation: 393 R Axis:   63 Text Interpretation: Sinus rhythm ST elev, probable normal early repol pattern Confirmed by Benjiman Core [phone]) on 04/27/2023 2:54:02 PM  Radiology CT Head Wo Contrast  Result Date: 04/27/2023 CLINICAL DATA:  Head trauma, moderate-severe  Syncope/presyncope, cerebrovascular cause suspected; Neck trauma, dangerous injury mechanism (Age 57-64y). EXAM: CT HEAD WITHOUT CONTRAST CT CERVICAL SPINE WITHOUT CONTRAST TECHNIQUE: Multidetector CT imaging of the head and cervical spine was performed following the standard protocol without intravenous contrast. Multiplanar CT image reconstructions of the cervical spine were also generated. RADIATION DOSE REDUCTION: This exam was performed according to the departmental dose-optimization program which includes automated exposure control, adjustment of the mA and/or kV according to patient size and/or use of iterative reconstruction technique. COMPARISON:  None Available. FINDINGS: CT HEAD  FINDINGS Brain: No acute intracranial hemorrhage. Gray-white differentiation is preserved. No hydrocephalus or extra-axial collection. No mass effect or midline shift. Vascular: No hyperdense vessel or unexpected calcification. Skull: No calvarial fracture or suspicious bone lesion. Skull base is unremarkable. Sinuses/Orbits: Unremarkable. Other: None. CT CERVICAL SPINE FINDINGS Alignment: Normal. Skull base and vertebrae: No acute fracture. Normal craniocervical junction. No suspicious bone lesions. Soft tissues and spinal canal: No prevertebral fluid or swelling. No visible canal hematoma. Disc levels: Mild lower cervical spondylosis without high-grade spinal canal stenosis. Upper chest: Unremarkable. Other: None. IMPRESSION: 1. No acute intracranial abnormality. 2. No acute cervical spine fracture or traumatic malalignment. Electronically Signed   By: Orvan Falconer M.D.   On: 04/27/2023 14:22   CT Cervical Spine Wo Contrast  Result Date: 04/27/2023 CLINICAL DATA:  Head trauma, moderate-severe Syncope/presyncope, cerebrovascular cause suspected; Neck trauma, dangerous injury mechanism (Age 50-64y). EXAM: CT HEAD WITHOUT CONTRAST CT CERVICAL SPINE WITHOUT CONTRAST TECHNIQUE: Multidetector CT imaging of the head and cervical  spine was performed following the standard protocol without intravenous contrast. Multiplanar CT image reconstructions of the cervical spine were also generated. RADIATION DOSE REDUCTION: This exam was performed according to the departmental dose-optimization program which includes automated exposure control, adjustment of the mA and/or kV according to patient size and/or use of iterative reconstruction technique. COMPARISON:  None Available. FINDINGS: CT HEAD FINDINGS Brain: No acute intracranial hemorrhage. Gray-white differentiation is preserved. No hydrocephalus or extra-axial collection. No mass effect or midline shift. Vascular: No hyperdense vessel or unexpected calcification. Skull: No calvarial fracture or suspicious bone lesion. Skull base is unremarkable. Sinuses/Orbits: Unremarkable. Other: None. CT CERVICAL SPINE FINDINGS Alignment: Normal. Skull base and vertebrae: No acute fracture. Normal craniocervical junction. No suspicious bone lesions. Soft tissues and spinal canal: No prevertebral fluid or swelling. No visible canal hematoma. Disc levels: Mild lower cervical spondylosis without high-grade spinal canal stenosis. Upper chest: Unremarkable. Other: None. IMPRESSION: 1. No acute intracranial abnormality. 2. No acute cervical spine fracture or traumatic malalignment. Electronically Signed   By: Orvan Falconer M.D.   On: 04/27/2023 14:22    Procedures Procedures  Orthostatic Lying BP- Lying: 101/53 Pulse- Lying: 53 Orthostatic Sitting BP- Sitting: 90/71 Pulse- Sitting: 67 Orthostatic Standing at 0 minutes BP- Standing at 0 minutes: 117/57 Pulse- Standing at 0 minutes: 88 Orthostatic Standing at 3 minutes BP- Standing at 3 minutes: 93/64 Pulse- Standing at 3 minutes: 78  Medications Ordered in ED Medications  Tdap (BOOSTRIX) injection 0.5 mL (0.5 mLs Intramuscular Given 04/27/23 1435)  lactated ringers bolus 1,000 mL (0 mLs Intravenous Stopped 04/27/23 1612)    ED Course/ Medical  Decision Making/ A&P                             Medical Decision Making Amount and/or Complexity of Data Reviewed Labs: ordered. Decision-making details documented in ED Course. Radiology: ordered. Decision-making details documented in ED Course. ECG/medicine tests: ordered. Decision-making details documented in ED Course.  Risk Prescription drug management.   Medical Decision Making:   Namari Doswell is a 32 y.o. male who presented to the ED today with syncope detailed above.    Patient placed on continuous vitals and telemetry monitoring while in ED which was reviewed periodically.  Complete initial physical exam performed, notably the patient was in no acute distress.  He had multiple superficial abrasions over the face.  Tenderness over the trapezius muscles bilaterally.  No midline spinal tenderness, step-offs, or deformities.  Neurologically intact.  Small superficial laceration to the right third finger without active bleeding.  Range of motion intact.    Reviewed and confirmed nursing documentation for past medical history, family history, social history.    Initial Assessment:   With the patient's presentation, differential diagnosis includes but is not limited to CVA, spinal cord injury, ACS, arrhythmia, syncope, orthostatic hypotension, sepsis, hypoglycemia, hypoxia, electrolyte disturbance, endocrine disorder, anemia, environmental exposure, polypharmacy, simple vs complex laceration, vasovagal syncope.  This is most consistent with an acute complicated illness  Initial Plan:  Screening labs including CBC and Metabolic panel to evaluate for infectious or metabolic etiology of disease.  EKG to evaluate for cardiac pathology Orthostatic vital signs CT head and cervical spine to assess for traumatic injuries Tdap updated Wound care Objective evaluation as below reviewed   Initial Study Results:   Laboratory  All laboratory results reviewed without evidence of clinically  relevant pathology.     EKG EKG was reviewed independently. NSR. No STEMI.   Radiology:  All images reviewed independently. Agree with radiology report at this time.   CT Head Wo Contrast  Result Date: 04/27/2023 CLINICAL DATA:  Head trauma, moderate-severe Syncope/presyncope, cerebrovascular cause suspected; Neck trauma, dangerous injury mechanism (Age 44-64y). EXAM: CT HEAD WITHOUT CONTRAST CT CERVICAL SPINE WITHOUT CONTRAST TECHNIQUE: Multidetector CT imaging of the head and cervical spine was performed following the standard protocol without intravenous contrast. Multiplanar CT image reconstructions of the cervical spine were also generated. RADIATION DOSE REDUCTION: This exam was performed according to the departmental dose-optimization program which includes automated exposure control, adjustment of the mA and/or kV according to patient size and/or use of iterative reconstruction technique. COMPARISON:  None Available. FINDINGS: CT HEAD FINDINGS Brain: No acute intracranial hemorrhage. Gray-white differentiation is preserved. No hydrocephalus or extra-axial collection. No mass effect or midline shift. Vascular: No hyperdense vessel or unexpected calcification. Skull: No calvarial fracture or suspicious bone lesion. Skull base is unremarkable. Sinuses/Orbits: Unremarkable. Other: None. CT CERVICAL SPINE FINDINGS Alignment: Normal. Skull base and vertebrae: No acute fracture. Normal craniocervical junction. No suspicious bone lesions. Soft tissues and spinal canal: No prevertebral fluid or swelling. No visible canal hematoma. Disc levels: Mild lower cervical spondylosis without high-grade spinal canal stenosis. Upper chest: Unremarkable. Other: None. IMPRESSION: 1. No acute intracranial abnormality. 2. No acute cervical spine fracture or traumatic malalignment. Electronically Signed   By: Orvan Falconer M.D.   On: 04/27/2023 14:22   CT Cervical Spine Wo Contrast  Result Date: 04/27/2023 CLINICAL  DATA:  Head trauma, moderate-severe Syncope/presyncope, cerebrovascular cause suspected; Neck trauma, dangerous injury mechanism (Age 61-64y). EXAM: CT HEAD WITHOUT CONTRAST CT CERVICAL SPINE WITHOUT CONTRAST TECHNIQUE: Multidetector CT imaging of the head and cervical spine was performed following the standard protocol without intravenous contrast. Multiplanar CT image reconstructions of the cervical spine were also generated. RADIATION DOSE REDUCTION: This exam was performed according to the departmental dose-optimization program which includes automated exposure control, adjustment of the mA and/or kV according to patient size and/or use of iterative reconstruction technique. COMPARISON:  None Available. FINDINGS: CT HEAD FINDINGS Brain: No acute intracranial hemorrhage. Gray-white differentiation is preserved. No hydrocephalus or extra-axial collection. No mass effect or midline shift. Vascular: No hyperdense vessel or unexpected calcification. Skull: No calvarial fracture or suspicious bone lesion. Skull base is unremarkable. Sinuses/Orbits: Unremarkable. Other: None. CT CERVICAL SPINE FINDINGS Alignment: Normal. Skull base and vertebrae: No acute fracture. Normal craniocervical junction. No suspicious bone lesions. Soft tissues and spinal canal: No prevertebral fluid or  swelling. No visible canal hematoma. Disc levels: Mild lower cervical spondylosis without high-grade spinal canal stenosis. Upper chest: Unremarkable. Other: None. IMPRESSION: 1. No acute intracranial abnormality. 2. No acute cervical spine fracture or traumatic malalignment. Electronically Signed   By: Orvan Falconer M.D.   On: 04/27/2023 14:22      Final Assessment and Plan:   32 year old male presents to the ED for evaluation from urgent care after suspected vasovagal episode following sustaining a laceration to his right middle finger.  History of similar.  On initial exam, he is neurologically intact.  He did have syncope and collapse  and sustained multiple facial injuries and is complaining of neck pain.  He has tenderness over the trapezius muscles.  No anticoagulant use.  No vision changes, focal weakness, or red flag symptoms following head injury.  With unexplained syncope, however, we will go ahead and obtain imaging to assess for etiology of syncope and traumatic injuries of the head and cervical spine.  Blood work initiated from triage for evaluation of syncope.  Low suspicion for acute process.  Patient has a superficial laceration to the right middle finger with fully approximated edges.  No repairable wound.  No active bleeding.  TDAP updated. Wounds irrigated by nursing staff. Abdomen soft and nontender.  Chest wall nontender and without signs of trauma.  Facial bones are stable.  No midline spinal tenderness.  Work essentially normal.  CT head and cervical spine without acute traumatic injuries.  Orthostatics obtained and while patient is not by definition orthostatic his blood pressure does appear to run on the low side.  This may be his baseline but difficult to tell.  Liter of fluids given to help with any potential underlying orthostasis.  Patient ambulatory in ED following this without complaint or difficulty.  With this, believe patient is safe for discharge home.  Will start muscle relaxers to help with musculoskeletal pain following injuries today.  Will have him closely follow-up with primary care for reevaluation.  Strict ED return precautions given, all questions answered, and stable for discharge.   Clinical Impression:  1. Vasovagal syncope   2. Injury of head, initial encounter   3. Strain of neck muscle, initial encounter   4. Laceration of right middle finger without foreign body without damage to nail, initial encounter      Discharge           Final Clinical Impression(s) / ED Diagnoses Final diagnoses:  Vasovagal syncope  Injury of head, initial encounter  Strain of neck muscle, initial  encounter  Laceration of right middle finger without foreign body without damage to nail, initial encounter    Rx / DC Orders ED Discharge Orders          Ordered    cyclobenzaprine (FLEXERIL) 10 MG tablet  3 times daily PRN        04/27/23 1619    lidocaine (LIDODERM) 5 %  Every 24 hours        04/27/23 1619              Richardson Dopp 04/27/23 1632    Benjiman Core, MD 04/28/23 [phone]

## 2023-04-27 NOTE — Discharge Instructions (Addendum)
Thank you for letting us take care of you today.  Your blood work and scans were normal. We gave you an updated tetanus shot and a liter of fluids to raise your blood pressure as it was slightly low which may be contributing to your syncopal episodes. They seem more likely to be related to a vasovagal event after cutting your finger which is usually related to an external stressor that causes your blood pressure to drop suddenly.  Please keep your wounds clean, dry, and use antibiotic ointment. Follow up with your PCP within 1 week to discuss your ED visit today, any continued symptoms, and for recheck of your blood pressure. For any new injury or worsening symptoms, please return to nearest ED for re-evaluation.

## 2023-04-27 NOTE — ED Notes (Signed)
Pt ambulated to and from restroom without assistance, [place] PA made aware

## 2023-04-27 NOTE — ED Notes (Signed)
Patient is being discharged from the Urgent Care and sent to the Emergency Department via EMS . Per Rennis Chris NP, patient is in need of higher level of care due to hypotension,  LOC. Patient is aware and verbalizes understanding of plan of care.  Vitals:   04/27/23 1141 04/27/23 1231  BP: 97/64 110/70  Pulse:    Resp:    Temp:    SpO2:

## 2023-04-28 ENCOUNTER — Ambulatory Visit
Admission: EM | Admit: 2023-04-28 | Discharge: 2023-04-28 | Disposition: A | Discharge status: Home / Self Care | Payer: PRIVATE HEALTH INSURANCE

## 2023-04-28 DIAGNOSIS — T148XXD Other injury of unspecified body region, subsequent encounter: Secondary | ICD-10-CM

## 2023-04-28 NOTE — ED Triage Notes (Signed)
Patient presents to [facility] for wound check. States he was seen yesterday at the ED for laceration to right middle finger. He reports no sutures placed or antibiotics prescribed. He states they placed a Band-Aid and today when trying to remove he pulled off some skin. He is now concerned it is infected or he needs sutures.

## 2023-04-28 NOTE — Discharge Instructions (Addendum)
Wound and change dressing daily as demonstrated in clinic.

## 2023-04-28 NOTE — ED Provider Notes (Signed)
Joshua Santos    CSN: [id number] Arrival date & time: 04/28/23  1542      History   Chief Complaint No chief complaint on file.   HPI Joshua Santos is a 32 y.o. male.   HPI  Patient presents to urgent care for wound check.  He was seen yesterday at ED following in incident at work in which he had vasovagal syncope following an injury to his finger after it was cut on a metal object.    Per ED note, patient had multiple superficial abrasions on his face from the fall (syncope), tenderness over trapezius muscles, small superficial laceration to third right middle finger without active bleeding.  CT imaging was performed (unremarkable) to assess for etiology of syncope and traumatic injuries of head and cervical spine.  Wounds were irrigated by nursing staff.  Td was updated.  Patient was discharged with muscle relaxers to help with MSK pain.  Presents today stating that the wound to his finger was reinjured while removing the "Band-Aid" which he applied after cleaning the wound and changing the dressing last night.  He expresses concern regarding possible infection or need for sutures.  Past Medical History:  Diagnosis Date   ADHD    Asthma    Syncopal episodes     There are no problems to display for this patient.   No past surgical history on file.     Home Medications    Prior to Admission medications   Medication Sig Start Date End Date Taking? Authorizing Provider  albuterol (VENTOLIN HFA) 108 (90 Base) MCG/ACT inhaler Inhale 2 puffs into the lungs every 6 (six) hours as needed for wheezing or shortness of breath. 04/11/19   Georgina Quint, MD  cyclobenzaprine (FLEXERIL) 10 MG tablet Take 1 tablet (10 mg total) by mouth 3 (three) times daily as needed for up to 5 days for muscle spasms. 04/27/23 05/02/23  Gowens, Mariah L, PA-C  fluticasone furoate-vilanterol (BREO ELLIPTA) 100-25 MCG/INH AEPB Inhale 1 puff into the lungs daily.    [provider]   fluticasone-salmeterol (ADVAIR HFA) [phone] MCG/ACT inhaler Inhale 2 puffs into the lungs 2 (two) times daily. 10/07/22   Ellsworth Lennox, PA-C  lidocaine (LIDODERM) 5 % Place 1 patch onto the skin daily for 5 days. Remove & Discard patch within 12 hours or as directed by MD 04/27/23 05/02/23  Ysidro Evert L, PA-C  Multiple Vitamin (MULTI VITAMIN DAILY PO) Take by mouth daily.    [provider]    Family History Family History  Problem Relation Age of Onset   Anxiety disorder Mother    Cancer Maternal Grandfather        brain    Social History Social History   Tobacco Use   Smoking status: Never   Smokeless tobacco: Never  Substance Use Topics   Alcohol use: Yes    Alcohol/week: 0.0 standard drinks of alcohol    Comment: slightly-wine   Drug use: No     Allergies   Motrin [ibuprofen]   Review of Systems Review of Systems   Physical Exam Triage Vital Signs ED Triage Vitals  Enc Vitals Group     BP      Pulse      Resp      Temp      Temp src      SpO2      Weight      Height      Head Circumference  Peak Flow      Pain Score      Pain Loc      Pain Edu?      Excl. in GC?    No data found.  Updated Vital Signs There were no vitals taken for this visit.  Visual Acuity Right Eye Distance:   Left Eye Distance:   Bilateral Distance:    Right Eye Near:   Left Eye Near:    Bilateral Near:     Physical Exam   UC Treatments / Results  Labs (all labs ordered are listed, but only abnormal results are displayed) Labs Reviewed - No data to display  EKG   Radiology CT Head Wo Contrast  Result Date: 04/27/2023 CLINICAL DATA:  Head trauma, moderate-severe Syncope/presyncope, cerebrovascular cause suspected; Neck trauma, dangerous injury mechanism (Age 4-64y). EXAM: CT HEAD WITHOUT CONTRAST CT CERVICAL SPINE WITHOUT CONTRAST TECHNIQUE: Multidetector CT imaging of the head and cervical spine was performed following the standard protocol  without intravenous contrast. Multiplanar CT image reconstructions of the cervical spine were also generated. RADIATION DOSE REDUCTION: This exam was performed according to the departmental dose-optimization program which includes automated exposure control, adjustment of the mA and/or kV according to patient size and/or use of iterative reconstruction technique. COMPARISON:  None Available. FINDINGS: CT HEAD FINDINGS Brain: No acute intracranial hemorrhage. Gray-white differentiation is preserved. No hydrocephalus or extra-axial collection. No mass effect or midline shift. Vascular: No hyperdense vessel or unexpected calcification. Skull: No calvarial fracture or suspicious bone lesion. Skull base is unremarkable. Sinuses/Orbits: Unremarkable. Other: None. CT CERVICAL SPINE FINDINGS Alignment: Normal. Skull base and vertebrae: No acute fracture. Normal craniocervical junction. No suspicious bone lesions. Soft tissues and spinal canal: No prevertebral fluid or swelling. No visible canal hematoma. Disc levels: Mild lower cervical spondylosis without high-grade spinal canal stenosis. Upper chest: Unremarkable. Other: None. IMPRESSION: 1. No acute intracranial abnormality. 2. No acute cervical spine fracture or traumatic malalignment. Electronically Signed   By: Orvan Falconer M.D.   On: 04/27/2023 14:22   CT Cervical Spine Wo Contrast  Result Date: 04/27/2023 CLINICAL DATA:  Head trauma, moderate-severe Syncope/presyncope, cerebrovascular cause suspected; Neck trauma, dangerous injury mechanism (Age 82-64y). EXAM: CT HEAD WITHOUT CONTRAST CT CERVICAL SPINE WITHOUT CONTRAST TECHNIQUE: Multidetector CT imaging of the head and cervical spine was performed following the standard protocol without intravenous contrast. Multiplanar CT image reconstructions of the cervical spine were also generated. RADIATION DOSE REDUCTION: This exam was performed according to the departmental dose-optimization program which includes  automated exposure control, adjustment of the mA and/or kV according to patient size and/or use of iterative reconstruction technique. COMPARISON:  None Available. FINDINGS: CT HEAD FINDINGS Brain: No acute intracranial hemorrhage. Gray-white differentiation is preserved. No hydrocephalus or extra-axial collection. No mass effect or midline shift. Vascular: No hyperdense vessel or unexpected calcification. Skull: No calvarial fracture or suspicious bone lesion. Skull base is unremarkable. Sinuses/Orbits: Unremarkable. Other: None. CT CERVICAL SPINE FINDINGS Alignment: Normal. Skull base and vertebrae: No acute fracture. Normal craniocervical junction. No suspicious bone lesions. Soft tissues and spinal canal: No prevertebral fluid or swelling. No visible canal hematoma. Disc levels: Mild lower cervical spondylosis without high-grade spinal canal stenosis. Upper chest: Unremarkable. Other: None. IMPRESSION: 1. No acute intracranial abnormality. 2. No acute cervical spine fracture or traumatic malalignment. Electronically Signed   By: Orvan Falconer M.D.   On: 04/27/2023 14:22    Procedures Procedures (including critical care time)  Medications Ordered in UC Medications - No data to display  Initial Impression / Assessment and Plan / UC Course  I have reviewed the triage vital signs and the nursing notes.  Pertinent labs & imaging results that were available during my care of the patient were reviewed by me and considered in my medical decision making (see chart for details).   Joshua Santos is a 32 y.o. male presenting with laceration to finger. Patient is afebrile without recent antipyretics, satting well on room air. Overall is well appearing though non-toxic, well hydrated, without respiratory distress.  Several superficial abrasions on his face in a state of healing.  Superficial skin tear on his right middle finger at the DIP with some clear discharge.  There is some minor local inflammation and  erythema surrounding the wound.  No evidence of infection at the finger wound.  Some localized inflammation is present which is expected on the second day following the injury.  Cleaned wound with Betadine solution, applied antibacterial ointment, covered with a nonadherent dressing and wrapped with Coban.  Demonstrated wound change and instructed patient to clean and redress the wound in a similar manner once a day at home.  Final Clinical Impressions(s) / UC Diagnoses   Final diagnoses:  None   Discharge Instructions   None    ED Prescriptions   None    PDMP not reviewed this encounter.   Charma Igo, [place] 04/28/23 1627

## 2023-08-04 ENCOUNTER — Encounter (HOSPITAL_BASED_OUTPATIENT_CLINIC_OR_DEPARTMENT_OTHER): Payer: Self-pay | Admitting: Emergency Medicine

## 2023-08-04 ENCOUNTER — Other Ambulatory Visit (HOSPITAL_COMMUNITY): Payer: Self-pay

## 2023-08-04 ENCOUNTER — Emergency Department (HOSPITAL_BASED_OUTPATIENT_CLINIC_OR_DEPARTMENT_OTHER)
Admission: EM | Admit: 2023-08-04 | Discharge: 2023-08-05 | Disposition: A | Discharge status: Home / Self Care | Payer: Self-pay | Attending: Emergency Medicine | Admitting: Emergency Medicine

## 2023-08-04 ENCOUNTER — Emergency Department (HOSPITAL_BASED_OUTPATIENT_CLINIC_OR_DEPARTMENT_OTHER): Payer: Self-pay

## 2023-08-04 ENCOUNTER — Emergency Department (HOSPITAL_COMMUNITY)
Admission: EM | Admit: 2023-08-04 | Discharge: 2023-08-04 | Disposition: A | Discharge status: Home / Self Care | Payer: PRIVATE HEALTH INSURANCE

## 2023-08-04 ENCOUNTER — Other Ambulatory Visit: Payer: Self-pay

## 2023-08-04 ENCOUNTER — Emergency Department (HOSPITAL_COMMUNITY): Payer: Self-pay

## 2023-08-04 DIAGNOSIS — H539 Unspecified visual disturbance: Secondary | ICD-10-CM | POA: Insufficient documentation

## 2023-08-04 DIAGNOSIS — R202 Paresthesia of skin: Secondary | ICD-10-CM

## 2023-08-04 LAB — COMPREHENSIVE METABOLIC PANEL
ALT: 40 U/L (ref 0–44)
AST: 27 U/L (ref 15–41)
Albumin: 4.7 g/dL (ref 3.5–5.0)
Alkaline Phosphatase: 55 U/L (ref 38–126)
Anion gap: 9 (ref 5–15)
BUN: 12 mg/dL (ref 6–20)
CO2: 29 mmol/L (ref 22–32)
Calcium: 9.2 mg/dL (ref 8.9–10.3)
Chloride: 101 mmol/L (ref 98–111)
Creatinine, Ser: 1.13 mg/dL (ref 0.61–1.24)
GFR, Estimated: >60 mL/min (ref >60)
Glucose, Bld: 95 mg/dL (ref 70–99)
Potassium: 3.7 mmol/L (ref 3.5–5.1)
Sodium: 139 mmol/L (ref 135–145)
Total Bilirubin: 0.6 mg/dL (ref 0.3–1.2)
Total Protein: 7.8 g/dL (ref 6.5–8.1)

## 2023-08-04 LAB — CBC WITH DIFFERENTIAL/PLATELET
Abs Immature Granulocytes: 0.01 10*3/uL (ref 0.00–0.07)
Basophils Absolute: 0 10*3/uL (ref 0.0–0.1)
Basophils Relative: 1 %
Eosinophils Absolute: 0.1 10*3/uL (ref 0.0–0.5)
Eosinophils Relative: 2 %
HCT: 46.1 % (ref 39.0–52.0)
Hemoglobin: 16.5 g/dL (ref 13.0–17.0)
Immature Granulocytes: 0 %
Lymphocytes Relative: 28 %
Lymphs Abs: 1.8 10*3/uL (ref 0.7–4.0)
MCH: 31.9 pg (ref 26.0–34.0)
MCHC: 35.8 g/dL (ref 30.0–36.0)
MCV: 89 fL (ref 80.0–100.0)
Monocytes Absolute: 0.5 10*3/uL (ref 0.1–1.0)
Monocytes Relative: 7 %
Neutro Abs: 4.2 10*3/uL (ref 1.7–7.7)
Neutrophils Relative %: 62 %
Platelets: 274 10*3/uL (ref 150–400)
RBC: 5.18 MIL/uL (ref 4.22–5.81)
RDW: 11.7 % (ref 11.5–15.5)
WBC: 6.6 10*3/uL (ref 4.0–10.5)
nRBC: 0 % (ref 0.0–0.2)

## 2023-08-04 MED ORDER — DIPHENHYDRAMINE HCL 50 MG/ML IJ SOLN
12.5000 mg | Freq: Once | INTRAMUSCULAR | Status: DC
  Filled 2023-08-04: qty 1

## 2023-08-04 MED ORDER — METOCLOPRAMIDE HCL 5 MG/ML IJ SOLN
5.0000 mg | Freq: Once | INTRAMUSCULAR | Status: DC
  Filled 2023-08-04: qty 2

## 2023-08-04 MED ORDER — GADOBUTROL 1 MMOL/ML IV SOLN
9.0000 mL | Freq: Once | INTRAVENOUS | Status: AC | PRN
  Administered 2023-08-04: 9 mL via INTRAVENOUS

## 2023-08-04 NOTE — ED Notes (Signed)
Pt called again, no answer. 

## 2023-08-04 NOTE — ED Provider Notes (Signed)
[place] [facility] AT [facility] Provider Note   CSN: [id number] Arrival date & time: 08/04/23  1819     History  Chief Complaint  Patient presents with   Numbness    Joshua Santos is a 32 y.o. male who presents emergency department with a chief complaint of neurologic deficits.  Patient states that at 3:30 PM after driving home from an interview he had sudden onset of right eye pain and pressure this was followed by a visual field cut in the medial part of the right eye.  He is having difficulty reading his phone screen and understanding the text because of vision impairment.  Patient then began having numbness in his mouth and then began having numbness in the left arm.  He felt off balance.  Symptoms resolved after approximately 45 minutes.  No previous history of migraine headaches.  Patient states that he still feels that his vision is a little bit off.  HPI     Home Medications Prior to Admission medications   Medication Sig Start Date End Date Taking? Authorizing Provider  albuterol (VENTOLIN HFA) 108 (90 Base) MCG/ACT inhaler Inhale 2 puffs into the lungs every 6 (six) hours as needed for wheezing or shortness of breath. 04/11/19   Georgina Quint, MD  fluticasone furoate-vilanterol (BREO ELLIPTA) 100-25 MCG/INH AEPB Inhale 1 puff into the lungs daily.    [provider]  fluticasone-salmeterol (ADVAIR HFA) [phone] MCG/ACT inhaler Inhale 2 puffs into the lungs 2 (two) times daily. 10/07/22   Ellsworth Lennox, PA-C  Multiple Vitamin (MULTI VITAMIN DAILY PO) Take by mouth daily.    [provider]      Allergies    Other, Banana, Bee venom, and Motrin [ibuprofen]    Review of Systems   Review of Systems  Physical Exam Updated Vital Signs BP (!) 131/92 (BP Location: Right Arm)   Pulse 82   Temp 98.2 F (36.8 C)   Resp 16   Ht 6\' 1"  (1.854 m)   Wt 90.7 kg   SpO2 100%   BMI 26.39 kg/m  Physical Exam Vitals and nursing note  reviewed.  Constitutional:      General: He is not in acute distress.    Appearance: He is well-developed. He is not diaphoretic.  HENT:     Head: Normocephalic and atraumatic.     Mouth/Throat:     Mouth: Oropharynx is clear and moist.  Eyes:     General: No scleral icterus.    Extraocular Movements: EOM normal.     Conjunctiva/sclera: Conjunctivae normal.     Pupils: Pupils are equal, round, and reactive to light.     Comments: No horizontal, vertical or rotational nystagmus Visual fields intact BL vision 20/20   Cardiovascular:     Rate and Rhythm: Normal rate and regular rhythm.  Pulmonary:     Effort: Pulmonary effort is normal. No respiratory distress.     Breath sounds: Normal breath sounds. No wheezing or rales.  Abdominal:     General: Bowel sounds are normal.     Palpations: Abdomen is soft.     Tenderness: There is no abdominal tenderness. There is no guarding or rebound.  Musculoskeletal:        General: Normal range of motion.     Cervical back: Normal range of motion and neck supple.  Lymphadenopathy:     Cervical: No cervical adenopathy.  Skin:    General: Skin is warm and dry.  Findings: No rash.  Neurological:     Mental Status: He is alert and oriented to person, place, and time.     Cranial Nerves: No cranial nerve deficit.     Motor: No abnormal muscle tone.     Coordination: Coordination normal.     Deep Tendon Reflexes: Reflexes are normal and symmetric.     Comments: Mental Status:  Alert, oriented, thought content appropriate. Speech fluent without evidence of aphasia. Able to follow 2 step commands without difficulty.  Cranial Nerves:  II:  Peripheral visual fields grossly normal, pupils equal, round, reactive to light III,IV, VI: ptosis not present, extra-ocular motions intact bilaterally  V,VII: smile symmetric, facial light touch sensation equal VIII: hearing grossly normal bilaterally  IX,X: midline uvula rise  XI: bilateral shoulder  shrug equal and strong XII: midline tongue extension  Motor:  5/5 in upper and lower extremities bilaterally including strong and equal grip strength and dorsiflexion/plantar flexion Sensory: Pinprick and light touch normal in all extremities.  Deep Tendon Reflexes: 2+ and symmetric  Cerebellar: normal finger-to-nose with bilateral upper extremities Gait: normal gait and balance CV: distal pulses palpable throughout   Psychiatric:        Mood and Affect: Mood and affect normal.        Behavior: Behavior normal.        Thought Content: Thought content normal.        Judgment: Judgment normal.     ED Results / Procedures / Treatments   Labs (all labs ordered are listed, but only abnormal results are displayed) Labs Reviewed  CBC WITH DIFFERENTIAL/PLATELET  COMPREHENSIVE METABOLIC PANEL    EKG None  Radiology CT Head Wo Contrast  Result Date: 08/04/2023 CLINICAL DATA:  Headache EXAM: CT HEAD WITHOUT CONTRAST TECHNIQUE: Contiguous axial images were obtained from the base of the skull through the vertex without intravenous contrast. RADIATION DOSE REDUCTION: This exam was performed according to the departmental dose-optimization program which includes automated exposure control, adjustment of the mA and/or kV according to patient size and/or use of iterative reconstruction technique. COMPARISON:  04/27/2023 FINDINGS: Brain: There is no mass, hemorrhage or extra-axial collection. The size and configuration of the ventricles and extra-axial CSF spaces are normal. The brain parenchyma is normal, without acute or chronic infarction. Vascular: No abnormal hyperdensity of the major intracranial arteries or dural venous sinuses. No intracranial atherosclerosis. Skull: The visualized skull base, calvarium and extracranial soft tissues are normal. Sinuses/Orbits: No fluid levels or advanced mucosal thickening of the visualized paranasal sinuses. No mastoid or middle ear effusion. The orbits are  normal. IMPRESSION: Normal head CT. Electronically Signed   By: Deatra Robinson M.D.   On: 08/04/2023 19:37    Procedures Procedures    Medications Ordered in ED Medications - No data to display  ED Course/ Medical Decision Making/ A&P                                 Medical Decision Making Amount and/or Complexity of Data Reviewed Labs: ordered. Radiology: ordered.  Risk Prescription drug management.   32 year old male who presents the emergency department chief complaint of acute onset neurologic deficit starting at 3:30 PM.  Most of his symptoms have resolved he has no objective findings on exam.  Differential diagnosis includes complicated migraine, somatoform disorder, stroke less likely, optic neuritis/MS.  Patient given Reglan and Benadryl here in the emergency department.  I discussed the case  with Dr. Amada Jupiter who is recommending patient be transferred for MRI/MRA of the head and neck.  Orders have been placed.  Patient is informed of the need for transfer and is in agreement.  Reviewed the patient's labs which showed no acute abnormalities.  I have visualized and interpreted CT head without contrast which shows no acute findings.  KG shows sinus rhythm at a rate of 91.       Final Clinical Impression(s) / ED Diagnoses Final diagnoses:  None    Rx / DC Orders ED Discharge Orders     None         Arthor Captain, PA-C 08/04/23 2020    Terald Sleeper, MD 08/04/23 [phone]

## 2023-08-04 NOTE — ED Notes (Signed)
Patient called, no answer.

## 2023-08-04 NOTE — ED Notes (Addendum)
IV flushed and patent, secured with coban, family member with pt for transport.

## 2023-08-04 NOTE — ED Triage Notes (Signed)
Pt arrives to ED with c/o left arm, lip, roof of mouth, and nose numbness that started at 5pm today. He notes the arm and lip numbness lasted 30-45 minutes.   Pt also notes right eye pain/pressure and spotty vision around 330 this afternoon.

## 2023-08-05 NOTE — ED Notes (Signed)
Patient states he was having numbness in left arm and tip of nose. Patient denies having the feelings of numbness at this time.

## 2023-08-05 NOTE — ED Notes (Signed)
Pt refused to keep b/p cuff and SpO2 cords, monitors removed.

## 2023-08-05 NOTE — ED Provider Notes (Signed)
Results for orders placed or performed during the hospital encounter of 08/04/23  CBC with Differential  Result Value Ref Range   WBC 6.6 4.0 - 10.5 K/uL   RBC 5.18 4.22 - 5.81 MIL/uL   Hemoglobin 16.5 13.0 - 17.0 g/dL   HCT 91.4 78.2 - 95.6 %   MCV 89.0 80.0 - 100.0 fL   MCH 31.9 26.0 - 34.0 pg   MCHC 35.8 30.0 - 36.0 g/dL   RDW 21.3 08.6 - 57.8 %   Platelets 274 150 - 400 K/uL   nRBC 0.0 0.0 - 0.2 %   Neutrophils Relative % 62 %   Neutro Abs 4.2 1.7 - 7.7 K/uL   Lymphocytes Relative 28 %   Lymphs Abs 1.8 0.7 - 4.0 K/uL   Monocytes Relative 7 %   Monocytes Absolute 0.5 0.1 - 1.0 K/uL   Eosinophils Relative 2 %   Eosinophils Absolute 0.1 0.0 - 0.5 K/uL   Basophils Relative 1 %   Basophils Absolute 0.0 0.0 - 0.1 K/uL   Immature Granulocytes 0 %   Abs Immature Granulocytes 0.01 0.00 - 0.07 K/uL  Comprehensive metabolic panel  Result Value Ref Range   Sodium 139 135 - 145 mmol/L   Potassium 3.7 3.5 - 5.1 mmol/L   Chloride 101 98 - 111 mmol/L   CO2 29 22 - 32 mmol/L   Glucose, Bld 95 70 - 99 mg/dL   BUN 12 6 - 20 mg/dL   Creatinine, Ser 4.69 0.61 - 1.24 mg/dL   Calcium 9.2 8.9 - 62.9 mg/dL   Total Protein 7.8 6.5 - 8.1 g/dL   Albumin 4.7 3.5 - 5.0 g/dL   AST 27 15 - 41 U/L   ALT 40 0 - 44 U/L   Alkaline Phosphatase 55 38 - 126 U/L   Total Bilirubin 0.6 0.3 - 1.2 mg/dL   GFR, Estimated >52 >84 mL/min   Anion gap 9 5 - 15   MR Brain W and Wo Contrast  Result Date: 08/05/2023 CLINICAL DATA:  Acute neurologic deficit EXAM: MRI HEAD WITHOUT AND WITH CONTRAST MRA HEAD WITHOUT CONTRAST MRA NECK WITHOUT CONTRAST TECHNIQUE: Multiplanar, multiecho pulse sequences of the brain and surrounding structures were obtained without and with intravenous contrast. Angiographic images of the head were obtained using MRA technique without contrast. Angiographic images of the neck and surrounding structures were obtained using MRA technique without intravenous contrast. CONTRAST:  9mL GADAVIST  GADOBUTROL 1 MMOL/ML IV SOLN COMPARISON:  None Available. FINDINGS: MRI HEAD FINDINGS Brain: No acute infarct, mass effect or extra-axial collection. No acute or chronic hemorrhage. Normal white matter signal, parenchymal volume and CSF spaces. The midline structures are normal. There is no abnormal contrast enhancement. Vascular: Major flow voids are preserved. Skull and upper cervical spine: Normal calvarium and skull base. Visualized upper cervical spine and soft tissues are normal. Sinuses/Orbits:No paranasal sinus fluid levels or advanced mucosal thickening. No mastoid or middle ear effusion. Normal orbits. MRA HEAD FINDINGS POSTERIOR CIRCULATION: --Vertebral arteries: Normal --Inferior cerebellar arteries: Normal. --Basilar artery: Normal. --Superior cerebellar arteries: Normal. --Posterior cerebral arteries: Normal. ANTERIOR CIRCULATION: --Intracranial internal carotid arteries: Normal. --Anterior cerebral arteries (ACA): Normal. --Middle cerebral arteries (MCA): Normal. MRA NECK FINDINGS Normal time-of-flight imaging of the carotid systems, codominant vertebral artery system and the aortic arch. IMPRESSION: 1. Normal MRI of the brain. 2. Normal MRA of the head and neck. Electronically Signed   By: Deatra Robinson M.D.   On: 08/05/2023 00:09   MR ANGIO HEAD WO  CONTRAST  Result Date: 08/05/2023 CLINICAL DATA:  Acute neurologic deficit EXAM: MRI HEAD WITHOUT AND WITH CONTRAST MRA HEAD WITHOUT CONTRAST MRA NECK WITHOUT CONTRAST TECHNIQUE: Multiplanar, multiecho pulse sequences of the brain and surrounding structures were obtained without and with intravenous contrast. Angiographic images of the head were obtained using MRA technique without contrast. Angiographic images of the neck and surrounding structures were obtained using MRA technique without intravenous contrast. CONTRAST:  9mL GADAVIST GADOBUTROL 1 MMOL/ML IV SOLN COMPARISON:  None Available. FINDINGS: MRI HEAD FINDINGS Brain: No acute infarct, mass  effect or extra-axial collection. No acute or chronic hemorrhage. Normal white matter signal, parenchymal volume and CSF spaces. The midline structures are normal. There is no abnormal contrast enhancement. Vascular: Major flow voids are preserved. Skull and upper cervical spine: Normal calvarium and skull base. Visualized upper cervical spine and soft tissues are normal. Sinuses/Orbits:No paranasal sinus fluid levels or advanced mucosal thickening. No mastoid or middle ear effusion. Normal orbits. MRA HEAD FINDINGS POSTERIOR CIRCULATION: --Vertebral arteries: Normal --Inferior cerebellar arteries: Normal. --Basilar artery: Normal. --Superior cerebellar arteries: Normal. --Posterior cerebral arteries: Normal. ANTERIOR CIRCULATION: --Intracranial internal carotid arteries: Normal. --Anterior cerebral arteries (ACA): Normal. --Middle cerebral arteries (MCA): Normal. MRA NECK FINDINGS Normal time-of-flight imaging of the carotid systems, codominant vertebral artery system and the aortic arch. IMPRESSION: 1. Normal MRI of the brain. 2. Normal MRA of the head and neck. Electronically Signed   By: Deatra Robinson M.D.   On: 08/05/2023 00:09   MR ANGIO NECK WO CONTRAST  Result Date: 08/05/2023 CLINICAL DATA:  Acute neurologic deficit EXAM: MRI HEAD WITHOUT AND WITH CONTRAST MRA HEAD WITHOUT CONTRAST MRA NECK WITHOUT CONTRAST TECHNIQUE: Multiplanar, multiecho pulse sequences of the brain and surrounding structures were obtained without and with intravenous contrast. Angiographic images of the head were obtained using MRA technique without contrast. Angiographic images of the neck and surrounding structures were obtained using MRA technique without intravenous contrast. CONTRAST:  9mL GADAVIST GADOBUTROL 1 MMOL/ML IV SOLN COMPARISON:  None Available. FINDINGS: MRI HEAD FINDINGS Brain: No acute infarct, mass effect or extra-axial collection. No acute or chronic hemorrhage. Normal white matter signal, parenchymal volume and  CSF spaces. The midline structures are normal. There is no abnormal contrast enhancement. Vascular: Major flow voids are preserved. Skull and upper cervical spine: Normal calvarium and skull base. Visualized upper cervical spine and soft tissues are normal. Sinuses/Orbits:No paranasal sinus fluid levels or advanced mucosal thickening. No mastoid or middle ear effusion. Normal orbits. MRA HEAD FINDINGS POSTERIOR CIRCULATION: --Vertebral arteries: Normal --Inferior cerebellar arteries: Normal. --Basilar artery: Normal. --Superior cerebellar arteries: Normal. --Posterior cerebral arteries: Normal. ANTERIOR CIRCULATION: --Intracranial internal carotid arteries: Normal. --Anterior cerebral arteries (ACA): Normal. --Middle cerebral arteries (MCA): Normal. MRA NECK FINDINGS Normal time-of-flight imaging of the carotid systems, codominant vertebral artery system and the aortic arch. IMPRESSION: 1. Normal MRI of the brain. 2. Normal MRA of the head and neck. Electronically Signed   By: Deatra Robinson M.D.   On: 08/05/2023 00:09   CT Head Wo Contrast  Result Date: 08/04/2023 CLINICAL DATA:  Headache EXAM: CT HEAD WITHOUT CONTRAST TECHNIQUE: Contiguous axial images were obtained from the base of the skull through the vertex without intravenous contrast. RADIATION DOSE REDUCTION: This exam was performed according to the departmental dose-optimization program which includes automated exposure control, adjustment of the mA and/or kV according to patient size and/or use of iterative reconstruction technique. COMPARISON:  04/27/2023 FINDINGS: Brain: There is no mass, hemorrhage or extra-axial collection. The size and configuration  of the ventricles and extra-axial CSF spaces are normal. The brain parenchyma is normal, without acute or chronic infarction. Vascular: No abnormal hyperdensity of the major intracranial arteries or dural venous sinuses. No intracranial atherosclerosis. Skull: The visualized skull base, calvarium and  extracranial soft tissues are normal. Sinuses/Orbits: No fluid levels or advanced mucosal thickening of the visualized paranasal sinuses. No mastoid or middle ear effusion. The orbits are normal. IMPRESSION: Normal head CT. Electronically Signed   By: Deatra Robinson M.D.   On: 08/04/2023 19:37    MRI's negative for acute findings.  Patient feeling back to baseline, no recurrence of numbness since initial episode.  He does tell me that he had a job interview today and has been very stressed out and a little anxious, suspect this may be contributing to his symptoms.  Will refer to outpatient neurology.  Can return here for any new/acute changes.   Garlon Hatchet, PA-C 08/05/23 [id number]    Nicanor Alcon, April, MD 08/05/23 [id number]

## 2023-08-05 NOTE — Discharge Instructions (Addendum)
Your MRIs today were normal. I have placed a referral for neurology, office should be contacting you with appointment. If headaches persist, recommend Tylenol/Motrin, can escalate to Excedrin migraine if needed. Return here for any new or acute changes.

## 2023-10-09 ENCOUNTER — Encounter: Payer: Self-pay | Admitting: Neurology

## 2023-10-09 ENCOUNTER — Ambulatory Visit: Payer: PRIVATE HEALTH INSURANCE | Admitting: Neurology

## 2024-02-10 NOTE — Progress Notes (Addendum)
 FAMILY MEDICINE - [facility] [place] [address]  February 10, 2024   Subjective:   Chief Complaint  Patient presents with  . paperwork     Joshua Santos is a 33 y.o. male presenting as he is requesting a letter for an emotional support animal [employer] snake).   He has a history of anxiety and depression, previously managed with medications that he has been without for a long time due to lack of insurance. He lives on his own and struggles with the routine of working long hours, coming home, and being on his own. The isolation makes both his anxiety and his mood worsen. The presence of an emotional support animal is expected provide that company, decreasing his anxiety and alleviating his mood a bit. In addition, the care he anticipates providing for it will help in both regards.   In choosing his emotional support animal, he is keeping in mind his schedule (typically works 10 or more hours a day, for 6 days a week), and he struggles with allergies to pet fur/dander. He also has a history of asthma, and several typical pets (cats, dogs, hamsters, gerbils, rabits) will trigger some reactivity due to their environments or their fur.  He is open to being on Buspirone again, as he does not recall have any adverse effects from it. He does not recall what kind of response he had with hydroxyzine.    Additionally, he reports lingering effects of a complex migraine yesterday (decreased cognitive function, trouble with speech, right arm and facial numbness). He still has a headache; all other symptoms have resolved. He typically manages with tylenol only, as he can't take any non-steroidal anti-inflammatory due to a severe childhood reaction.   He wants both his ears examined, as they are itching (without pain or decreased hearing) and he is prone to wax build-up, and he would like he lipids screened while here.   Allergies Allergies  Allergen Reactions  . Ibuprofen Rash  and Other (See Comments)    Face swelled, Childhood reaction   . Banana Other (See Comments)  . Nut - Unspecified Other (See Comments)  . Venom-Honey Bee Swelling    Medications Current Outpatient Medications  Medication Sig Dispense Refill  . budesonide-formoteroL (Symbicort) 80-4.5 mcg/actuation inhaler Inhale 2 puffs 2 (two) times a day. 1 each 6  . EPINEPHrine (EpiPen 2-Pak) 0.3 mg/0.3 mL injection syringe Inject 0.3 mL (0.3 mg total) into the thigh as needed for anaphylaxis. 0.3 mL 2  . fluocinolone acetonide oiL 0.01 % drop Instill 3 drops into both ears nightly for 14 days, then stop and use as needed for itch. 20 mL 2  . fluticasone  propionate (FLONASE) 50 mcg/spray nasal spray Administer 1 spray into each nostril daily. 16 g 11  . albuterol  HFA (PROVENTIL  HFA;VENTOLIN  HFA;PROAIR  HFA) 90 mcg/actuation inhaler Inhale 1 puff every 6 (six) hours as needed for wheezing. 18 g 0  . busPIRone (BUSPAR) 10 mg tablet Take 1 tablet (10 mg total) by mouth 2 (two) times a day Indications: repeated episodes of anxiety. 180 tablet 1  . hydrOXYzine (ATARAX) 25 mg tablet Indications: anxious. Take 1 tablet, if needed, for panic attacks. 30 tablet 0   No current facility-administered medications for this visit.    Medical history Past Medical History:  Diagnosis Date  . ADHD   . Allergy   . Asthma     Surgical History Past Surgical History:  Procedure Laterality Date  . TONSILLECTOMY  Social History Social History   Tobacco Use  . Smoking status: Never  . Smokeless tobacco: Never  Vaping Use  . Vaping status: Never Used  Substance Use Topics  . Alcohol use: Never  . Drug use: Never     Family History Family History  Problem Relation Name Age of Onset  . Asthma Mother    . Anxiety disorder Father       ROS:  Review of Systems  Constitutional:  Negative for fever.  HENT:  Negative for congestion, ear discharge, ear pain, rhinorrhea and sore throat.   Respiratory:   Negative for cough and shortness of breath.   Cardiovascular:  Negative for chest pain and palpitations.  Gastrointestinal:  Negative for abdominal pain, diarrhea, nausea and vomiting.  Genitourinary:  Negative for dysuria.  Neurological:  Negative for dizziness, light-headedness and headaches.  Psychiatric/Behavioral:  Positive for dysphoric mood. Negative for hallucinations, self-injury and suicidal ideas. The patient is nervous/anxious. The patient is not hyperactive.     Objective:   Vital Signs Vitals:   02/10/24 0813  BP: 114/60  BP Location: Left arm  Patient Position: Sitting  Pulse: 70  Resp: 15  SpO2: 96%  Weight: 89.4 kg (197 lb 3.2 oz)  Height: 1.829 m (6')     Physical Exam Vitals reviewed.  Constitutional:      General: He is not in acute distress.    Appearance: He is not ill-appearing.  HENT:     Head: Normocephalic.     Right Ear: External ear normal.     Left Ear: External ear normal.     Nose: No congestion or rhinorrhea.  Eyes:     General: No scleral icterus.       Right eye: No discharge.        Left eye: No discharge.  Cardiovascular:     Rate and Rhythm: Normal rate and regular rhythm.     Pulses: Normal pulses.  Pulmonary:     Effort: Pulmonary effort is normal. No respiratory distress.  Musculoskeletal:     Cervical back: No rigidity.  Skin:    General: Skin is warm and dry.     Capillary Refill: Capillary refill takes less than 2 seconds.  Neurological:     Mental Status: He is alert and oriented to person, place, and time.     Gait: Gait normal.  Psychiatric:        Mood and Affect: Mood normal.        Behavior: Behavior normal.        Thought Content: Thought content normal.        Judgment: Judgment normal.     Comments: Well-groomed young man, who appears appropriate for his age. He is in a slightly anxious and down mood, but okay. Affect is congruent with mood. Thoughts are linear and rational and speech is at a normal volume  and pace. He makes good eye-contact during the encounter and does not appear to be responding to internal stimuli.      Assessment/Plan:    Problem List Items Addressed This Visit     Adjustment disorder with mixed anxiety and depressed mood - Primary   Current Assessment & Plan  Resuming both Buspirone and Hydroxyzine, though advised to only use Hydroxyzine if he feels a panic attack starting. CMP ordered. Letter to be provided for emotional support animal.      Bilateral impacted cerumen   Current Assessment & Plan  Cerumen removed from both ears via  flushing. Patient tolerated procedure okay; felt a bit sweaty afterward.      Hypertriglyceridemia   Current Assessment & Plan  Lipid profile ordered.         After discussing questions and concerns, goals of care, medication side effects (if changes to their medications were made), and adequate follow-up, the patient verbalizes understanding and in agreement with the above plan. Advised patient to call clinic or return for visit if condition worsens or symptoms recur.  There are no barriers to discussed goal.  Return in about 4 months (around 06/11/2024), or if symptoms worsen or fail to improve, for annual comprehensive exam.   Portions of the history were initially obtained by a certified medical assistant Alica Louder). This also includes vital signs, as well as potentially assisting with components of physical exam (as noted above). All aspects of this encounter were reviewed by myself prior to completion of the encounter.     Helene Fare, MD

## 2024-02-18 NOTE — Telephone Encounter (Signed)
 Patient called and said his apartment complex is questioning his choice of emotional support animal so they are requesting additional information before they approve this. Patient sent the letter he received from the apartment complex to Dr Carles through the portal and would like a response ASAP before the apartment manager sees that he has the snake in his apartment. Patient said he is also going to email the letter to us  at the front desk and will also ask the apartment complex to fax it to us .

## 2024-03-17 NOTE — Telephone Encounter (Signed)
 Patient's voicemail box is not setup. After a couple of rings, it abruptly cut to a message explaining this. Likely he is at work.  Please reach out to patient and inquire as to if there is a need for further paperwork or supporting documentation. We have a random page here that doesn't have instructions on what to do.

## 2024-06-07 ENCOUNTER — Other Ambulatory Visit: Payer: Self-pay

## 2024-06-07 ENCOUNTER — Emergency Department: Payer: Self-pay

## 2024-06-07 ENCOUNTER — Emergency Department
Admission: EM | Admit: 2024-06-07 | Discharge: 2024-06-07 | Disposition: A | Discharge status: Home / Self Care | Payer: Self-pay | Attending: Emergency Medicine | Admitting: Emergency Medicine

## 2024-06-07 DIAGNOSIS — R002 Palpitations: Secondary | ICD-10-CM | POA: Insufficient documentation

## 2024-06-07 DIAGNOSIS — M7989 Other specified soft tissue disorders: Secondary | ICD-10-CM | POA: Insufficient documentation

## 2024-06-07 LAB — TROPONIN I (HIGH SENSITIVITY): Troponin I (High Sensitivity): <2 ng/L (ref <18)

## 2024-06-07 LAB — CBC
HCT: 45.2 % (ref 39.0–52.0)
Hemoglobin: 15.8 g/dL (ref 13.0–17.0)
MCH: 31.7 pg (ref 26.0–34.0)
MCHC: 35 g/dL (ref 30.0–36.0)
MCV: 90.8 fL (ref 80.0–100.0)
Platelets: 270 10*3/uL (ref 150–400)
RBC: 4.98 MIL/uL (ref 4.22–5.81)
RDW: 11.2 % — ABNORMAL LOW (ref 11.5–15.5)
WBC: 6.1 10*3/uL (ref 4.0–10.5)
nRBC: 0 % (ref 0.0–0.2)

## 2024-06-07 LAB — BASIC METABOLIC PANEL WITH GFR
Anion gap: 7 (ref 5–15)
BUN: 15 mg/dL (ref 6–20)
CO2: 30 mmol/L (ref 22–32)
Calcium: 9.8 mg/dL (ref 8.9–10.3)
Chloride: 104 mmol/L (ref 98–111)
Creatinine, Ser: 1.27 mg/dL — ABNORMAL HIGH (ref 0.61–1.24)
GFR, Estimated: >60 mL/min (ref >60)
Glucose, Bld: 99 mg/dL (ref 70–99)
Potassium: 3.7 mmol/L (ref 3.5–5.1)
Sodium: 141 mmol/L (ref 135–145)

## 2024-06-07 LAB — BRAIN NATRIURETIC PEPTIDE: B Natriuretic Peptide: 8.4 pg/mL (ref 0.0–100.0)

## 2024-06-07 NOTE — ED Provider Notes (Signed)
 [facility] Provider Note    Event Date/Time   First MD Initiated Contact with Patient 06/07/24 0040     (approximate)   History   Palpitations and Leg Swelling   HPI Joshua Santos is a 33 y.o. male who presents for evaluation of left foot swelling for several months and anxiety over medical conditions.  He said that when he was 33 years old he got choked on a cough drop and ever since then he has been very concerned that he is going to die and had anxiety over his medical conditions.  He has had intermittent palpitations and anxiety over the last several months.  About a month ago he went rollerblading and since then his left foot has been swollen.  He looked it up on the Internet and saw a picture that looked just like his foot and it said that he might have heart failure so he wanted to come in to make sure that he does not have heart failure.  He has no other signs or symptoms or concerns at this time except as described above.  He has no swelling except some pain and tenderness in the swelling of the foot itself.  It does not radiate throughout his leg.  He has no unilateral leg swelling, no recent surgeries nor immobilizations, no history of blood clots in the legs nor the lungs.  He just recently lost his job and has been under increased stress recently.  He has been evaluated in the past for the anxiety and the palpitations and no one has been able to find anything wrong.     Physical Exam   Triage Vital Signs: ED Triage Vitals  Encounter Vitals Group     BP 06/07/24 0040 128/85     Girls Systolic BP Percentile --      Girls Diastolic BP Percentile --      Boys Systolic BP Percentile --      Boys Diastolic BP Percentile --      Pulse Rate 06/07/24 0040 (!) 54     Resp 06/07/24 0040 16     Temp 06/07/24 0040 98.1 F (36.7 C)     Temp Source 06/07/24 0040 Oral     SpO2 06/07/24 0040 100 %     Weight 06/07/24 0038 90.7 kg (200 lb)     Height 06/07/24  0038 1.829 m (6')     Head Circumference --      Peak Flow --      Pain Score 06/07/24 0038 0     Pain Loc --      Pain Education --      Exclude from Growth Chart --     Most recent vital signs: Vitals:   06/07/24 0040  BP: 128/85  Pulse: (!) 54  Resp: 16  Temp: 98.1 F (36.7 C)  SpO2: 100%    General: Awake, no distress.  Well-appearing. CV:  Good peripheral perfusion.  Regular rate and rhythm, normal heart sounds. Resp:  Normal effort. Speaking easily and comfortably, no accessory muscle usage nor intercostal retractions.  Lungs are clear to auscultation. Abd:  No distention.    ED Results / Procedures / Treatments   Labs (all labs ordered are listed, but only abnormal results are displayed) Labs Reviewed  BASIC METABOLIC PANEL WITH GFR - Abnormal; Notable for the following components:      Result Value   Creatinine, Ser 1.27 (*)    All other components within normal  limits  CBC - Abnormal; Notable for the following components:   RDW 11.2 (*)    All other components within normal limits  BRAIN NATRIURETIC PEPTIDE  TROPONIN I (HIGH SENSITIVITY)     EKG  ED ECG REPORT I, Darleene Dome, the attending physician, personally viewed and interpreted this ECG.  Date: 06/07/2024 EKG Time: 00: 36 Rate: 74 Rhythm: normal sinus rhythm QRS Axis: normal Intervals: normal ST/T Wave abnormalities: normal Narrative Interpretation: no evidence of acute ischemia    RADIOLOGY I independently viewed and interpreted the patient's two-view chest x-ray and I see no evidence of pneumonia nor pneumothorax.  I also read the radiologist's report, which confirmed no acute findings.   PROCEDURES:  Critical Care performed: No  Procedures    IMPRESSION / MDM / ASSESSMENT AND PLAN / ED COURSE  I reviewed the triage vital signs and the nursing notes.                              Differential diagnosis includes, but is not limited to, anxiety, SVT or other nonspecific  arrhythmia, CHF, skill skeletal strain.  Patient's presentation is most consistent with acute presentation with potential threat to life or bodily function.  Labs/studies ordered: Two-view chest x-ray, BMP, BNP, high-sensitivity troponin, CBC  Interventions/Medications given:  Medications - No data to display  (Note:  hospital course my include additional interventions and/or labs/studies not listed above.)   Patient is a PERC negative, has a Wells score for DVT of -2, and has had symptoms for an extended period of time.  His workup tonight is very reassuring.  No indication for repeat troponin.  I provided reassurance and encouraged outpatient follow-up.    The patient's medical screening exam is reassuring with no indication of an emergent medical condition requiring hospitalization or additional evaluation at this point.  The patient is safe and appropriate for discharge and outpatient follow up.         FINAL CLINICAL IMPRESSION(S) / ED DIAGNOSES   Final diagnoses:  Swelling of left foot  Palpitations     Rx / DC Orders   ED Discharge Orders     None        Note:  This document was prepared using Dragon voice recognition software and may include unintentional dictation errors.   Dome Darleene, MD 06/07/24 [phone]

## 2024-06-07 NOTE — Discharge Instructions (Addendum)
 Your workup in the Emergency Department today was reassuring.  We did not find any specific abnormalities.  We recommend you drink plenty of fluids, take your regular medications and/or any new ones prescribed today, and follow up with the doctor(s) listed in these documents as recommended.  Return to the Emergency Department if you develop new or worsening symptoms that concern you.

## 2024-06-07 NOTE — ED Triage Notes (Signed)
 Pt to ED via POV c/o left foot swelling x6month and palpitations/cp x a few months. Pt denies any pain or palpitations at this time. Reports this has been going on for a long time and has been checked out for same but tests turn out fine. Hx of anxiety.

## 2024-08-07 ENCOUNTER — Other Ambulatory Visit: Payer: Self-pay

## 2024-08-07 ENCOUNTER — Emergency Department: Payer: Self-pay

## 2024-08-07 ENCOUNTER — Emergency Department
Admission: EM | Admit: 2024-08-07 | Discharge: 2024-08-07 | Disposition: A | Discharge status: Home / Self Care | Payer: Self-pay | Attending: Emergency Medicine | Admitting: Emergency Medicine

## 2024-08-07 DIAGNOSIS — J45909 Unspecified asthma, uncomplicated: Secondary | ICD-10-CM | POA: Insufficient documentation

## 2024-08-07 DIAGNOSIS — R002 Palpitations: Secondary | ICD-10-CM | POA: Insufficient documentation

## 2024-08-07 LAB — BASIC METABOLIC PANEL WITH GFR
Anion gap: 9 (ref 5–15)
BUN: 9 mg/dL (ref 6–20)
CO2: 28 mmol/L (ref 22–32)
Calcium: 9.3 mg/dL (ref 8.9–10.3)
Chloride: 103 mmol/L (ref 98–111)
Creatinine, Ser: 1.29 mg/dL — ABNORMAL HIGH (ref 0.61–1.24)
GFR, Estimated: >60 mL/min (ref >60)
Glucose, Bld: 85 mg/dL (ref 70–99)
Potassium: 3.3 mmol/L — ABNORMAL LOW (ref 3.5–5.1)
Sodium: 140 mmol/L (ref 135–145)

## 2024-08-07 LAB — CBC
HCT: 43 % (ref 39.0–52.0)
Hemoglobin: 15.2 g/dL (ref 13.0–17.0)
MCH: 31.8 pg (ref 26.0–34.0)
MCHC: 35.3 g/dL (ref 30.0–36.0)
MCV: 90 fL (ref 80.0–100.0)
Platelets: 296 K/uL (ref 150–400)
RBC: 4.78 MIL/uL (ref 4.22–5.81)
RDW: 11.3 % — ABNORMAL LOW (ref 11.5–15.5)
WBC: 6 K/uL (ref 4.0–10.5)
nRBC: 0 % (ref 0.0–0.2)

## 2024-08-07 LAB — MAGNESIUM: Magnesium: 2.4 mg/dL (ref 1.7–2.4)

## 2024-08-07 LAB — TSH: TSH: 1.695 u[IU]/mL (ref 0.350–4.500)

## 2024-08-07 LAB — TROPONIN I (HIGH SENSITIVITY): Troponin I (High Sensitivity): 5 ng/L (ref <18)

## 2024-08-07 LAB — T4, FREE: Free T4: 1.12 ng/dL (ref 0.61–1.12)

## 2024-08-07 NOTE — ED Provider Notes (Signed)
 [facility] Provider Note    Event Date/Time   First MD Initiated Contact with Patient 08/07/24 0153     (approximate)   History   Palpitations   HPI  Joshua Santos is a 33 y.o. male   Past medical history of anxiety and just ordered on Atarax by his psychiatrist, ADHD, asthma, presents to the Emergency Department with palpitations.  He has had longstanding palpitations feeling like there is an extra heartbeat, dating back many years and had a Holter monitoring a few years ago which was unremarkable.  He started having more of these palpitations after taking his Atarax for the first time today.  He reports no syncope.  No presyncope.  No chest pain or shortness of breath.  Feels well now.  He reports no increase in caffeine intake, no significant alcohol or drug use.  He has been stressed out with life stressors.  External Medical Documents Reviewed: Family medicine note from March 2025 document anxiety depression with a request for a letter for emotional support animal ball python snake      Physical Exam   Triage Vital Signs: ED Triage Vitals  Encounter Vitals Group     BP 08/07/24 0050 136/61     Girls Systolic BP Percentile --      Girls Diastolic BP Percentile --      Boys Systolic BP Percentile --      Boys Diastolic BP Percentile --      Pulse Rate 08/07/24 0050 69     Resp 08/07/24 0050 20     Temp 08/07/24 0050 98.3 F (36.8 C)     Temp Source 08/07/24 0050 Oral     SpO2 08/07/24 0050 96 %     Weight 08/07/24 0050 187 lb (84.8 kg)     Height 08/07/24 0050 6' (1.829 m)     Head Circumference --      Peak Flow --      Pain Score 08/07/24 0059 0     Pain Loc --      Pain Education --      Exclude from Growth Chart --     Most recent vital signs: Vitals:   08/07/24 0050 08/07/24 0052  BP: 136/61   Pulse: 69   Resp: 20   Temp: 98.3 F (36.8 C)   SpO2: 96% 98%    General: Awake, no distress. CV:  Good peripheral  perfusion.  Resp:  Normal effort.  Abd:  No distention.  Other:  Well-appearing young man in no acute distress.  Normal vital signs.  Skin appears warm well-perfused.  No tremors or tongue fasciculations, clear lungs, normal heart sounds rate and rhythm without murmur.   ED Results / Procedures / Treatments   Labs (all labs ordered are listed, but only abnormal results are displayed) Labs Reviewed  BASIC METABOLIC PANEL WITH GFR - Abnormal; Notable for the following components:      Result Value   Potassium 3.3 (*)    Creatinine, Ser 1.29 (*)    All other components within normal limits  CBC - Abnormal; Notable for the following components:   RDW 11.3 (*)    All other components within normal limits  MAGNESIUM  TSH  T4, FREE  TROPONIN I (HIGH SENSITIVITY)     I ordered and reviewed the above labs they are notable for cell counts and electrolytes largely unremarkable.  EKG  ED ECG REPORT I, Ginnie Shams, the attending physician, personally viewed and  interpreted this ECG.   Date: 08/07/2024  EKG Time: 0240  Rate: 45  Rhythm: Sinus bradycardia  Axis: nl  Intervals:nl  ST&T Change: No STEMI no acute ischemic changes no malignant dysrhythmias noted    RADIOLOGY I independently reviewed and interpreted chest x-ray and see no obvious focality or pneumothorax I also reviewed radiologist's formal read.   PROCEDURES:  Critical Care performed: No  Procedures   MEDICATIONS ORDERED IN ED: Medications - No data to display   IMPRESSION / MDM / ASSESSMENT AND PLAN / ED COURSE  I reviewed the triage vital signs and the nursing notes.                                Patient's presentation is most consistent with acute presentation with potential threat to life or bodily function.  Differential diagnosis includes, but is not limited to, dysrhythmia, electrolyte disturbance, considered but less likely substance-induced, withdrawal, cardiopulmonary emergency like ACS or PE  or dissection  The patient is on the cardiac monitor to evaluate for evidence of arrhythmia and/or significant heart rate changes.  MDM:    Palpitations in a young man with normal EKG, thus far normal lab analysis, and feels well now.  Check electrolytes for any abnormalities, thyroid  for thyroid  dysfunction.  Unlikely due to his hydroxyzine.  Intervals are normal.  He can stop this medication consult with psychiatrist for alternatives as he thinks that his symptoms may be due to an adverse effect of this medication.  He does not appear to be intoxicated, have any other obvious inciting events from review of systems, does not appear to be in withdrawal, and has no other acute medical complaints that would suggest any other life-threatening pathologies at this time.  Given his worsening palpitations, I think it prudent to get a Holter monitoring done to check for any dysrhythmias on a longer scale of time.  He will follow-up with his primary doctor to try to get this arranged, and I have sent for referral to cardiologist to help facilitate this testing as well.   I considered hospitalization for admission or observation however given the stability in the emergency department, no longer symptomatic, unremarkable workup, plan will be for discharge with outpatient follow-up as above        FINAL CLINICAL IMPRESSION(S) / ED DIAGNOSES   Final diagnoses:  Palpitations     Rx / DC Orders   ED Discharge Orders          Ordered    Ambulatory referral to Cardiology        08/07/24 0300             Note:  This document was prepared using Dragon voice recognition software and may include unintentional dictation errors.    Cyrena Mylar, MD 08/07/24 [phone]

## 2024-08-07 NOTE — ED Triage Notes (Signed)
 Pt arrives with c/o palpitations that started tonight. Per pt, he started taking hydroxine. Pt worried that meds cause prolonged Otc. Pt denies CP or SOB.

## 2024-08-07 NOTE — ED Notes (Signed)
 Pt refused repeat blood work.   MD made aware of same.

## 2024-08-07 NOTE — Discharge Instructions (Addendum)
 Fortunately your evaluation Emergency Department did not show any emergency conditions to account for your palpitations.  Please speak with your primary doctor about Holter monitoring or further testing as needed.  I made a referral to cardiology who can help arrange for Holter monitoring and further assessment as needed as well.  I would stop the hydroxyzine until you are able to speak with your psychiatrist about this potential side effect.  Thank you for choosing us  for your health care today!  Please see your primary doctor this week for a follow up appointment.   If you have any new, worsening, or unexpected symptoms call your doctor right away or come back to the emergency department for reevaluation.  It was my pleasure to care for you today.   Ginnie EDISON Cyrena, MD

## 2024-10-04 ENCOUNTER — Emergency Department
Admission: EM | Admit: 2024-10-04 | Discharge: 2024-10-04 | Disposition: A | Discharge status: Home / Self Care | Payer: Self-pay | Attending: Emergency Medicine | Admitting: Emergency Medicine

## 2024-10-04 ENCOUNTER — Emergency Department: Payer: Self-pay

## 2024-10-04 ENCOUNTER — Other Ambulatory Visit: Payer: Self-pay

## 2024-10-04 DIAGNOSIS — R09A2 Foreign body sensation, throat: Secondary | ICD-10-CM | POA: Insufficient documentation

## 2024-10-04 DIAGNOSIS — J45909 Unspecified asthma, uncomplicated: Secondary | ICD-10-CM | POA: Insufficient documentation

## 2024-10-04 MED ORDER — ALUM & MAG HYDROXIDE-SIMETH 200-200-20 MG/5ML PO SUSP
30.0000 mL | Freq: Once | ORAL | Status: DC
  Filled 2024-10-04: qty 30

## 2024-10-04 MED ORDER — LIDOCAINE VISCOUS HCL 2 % MT SOLN
15.0000 mL | Freq: Once | OROMUCOSAL | Status: DC
  Filled 2024-10-04: qty 15

## 2024-10-04 NOTE — ED Provider Notes (Signed)
 [facility] Provider Note    Event Date/Time   First MD Initiated Contact with Patient 10/04/24 1947     (approximate)  History   Chief Complaint: Choking  HPI  Joshua Santos is a 33 y.o. male with a past medical history of ADHD, asthma, presents to the emergency department for a foreign body sensation in the throat.  According to the patient he was eating dinner which included corn.  He felt like the corn did not go down properly and he coughed and was able to cough some back up but has a sensation that there was more stuck in the throat.  Patient states he started to have some trouble breathing but admits that he believes this was likely his anxiety.  Patient was worried so he came to the emergency department for evaluation.  Since arriving patient states that sensation is largely resolved no shortness of breath.  Physical Exam   Triage Vital Signs: ED Triage Vitals  Encounter Vitals Group     BP 10/04/24 1915 135/88     Girls Systolic BP Percentile --      Girls Diastolic BP Percentile --      Boys Systolic BP Percentile --      Boys Diastolic BP Percentile --      Pulse Rate 10/04/24 1915 75     Resp 10/04/24 1915 18     Temp 10/04/24 1915 98.5 F (36.9 C)     Temp Source 10/04/24 1915 Oral     SpO2 10/04/24 1915 96 %     Weight 10/04/24 1916 200 lb (90.7 kg)     Height 10/04/24 1916 6' (1.829 m)     Head Circumference --      Peak Flow --      Pain Score 10/04/24 1916 0     Pain Loc --      Pain Education --      Exclude from Growth Chart --     Most recent vital signs: Vitals:   10/04/24 1915  BP: 135/88  Pulse: 75  Resp: 18  Temp: 98.5 F (36.9 C)  SpO2: 96%    General: Awake, no distress.  CV:  Good peripheral perfusion.  Regular rate and rhythm  Resp:  Normal effort.  Equal breath sounds bilaterally.  Speaking in full sentences.  No distress. Abd:  No distention.   ED Results / Procedures / Treatments   RADIOLOGY  I have  reviewed and interpreted the x-ray images.  No foreign body seen on my evaluation. Radiology has read the x-ray is negative   MEDICATIONS ORDERED IN ED: Medications  alum & mag hydroxide-simeth (MAALOX/MYLANTA) 200-200-20 MG/5ML suspension 30 mL (30 mLs Oral Patient Refused/Not Given 10/04/24 2046)    And  lidocaine  (XYLOCAINE ) 2 % viscous mouth solution 15 mL (15 mLs Oral Patient Refused/Not Given 10/04/24 2046)     IMPRESSION / MDM / ASSESSMENT AND PLAN / ED COURSE  I reviewed the triage vital signs and the nursing notes.  Patient's presentation is most consistent with acute illness / injury with system symptoms.  Patient presents emergency department for a foreign body sensation in his throat.  Overall the patient appears well he is speaking in full sentences, no distress, swallowing secretions.  Patient's x-ray is negative for any finding.  Reassuring vital signs reassuring physical exam suspect likely foreign body sensation possible abrasion or irritation but this is largely resolved.  Will discharge home have patient follow-up with his doctor.  FINAL CLINICAL IMPRESSION(S) / ED DIAGNOSES   Foreign body sensation in throat   Note:  This document was prepared using Dragon voice recognition software and may include unintentional dictation errors.   Dorothyann Drivers, MD 10/04/24 2049

## 2024-10-04 NOTE — ED Notes (Signed)
 Patient refused blood work in triage stating he wanted to be seen by doctor or have imaging done before blood work.

## 2024-10-04 NOTE — ED Triage Notes (Signed)
 Patient ambulatory to triage with complaints of piece of corn feeling like it is stuck in his throat. States it occurred approx 10 mins PTA. Denies vomiting. Has tried soda PTA without relief. Endorses some minor shortness of breath without increased work of breathing.
